# Patient Record
Sex: Male | Born: 1950 | Race: White | Hispanic: No | Marital: Married | Smoking: Current some day smoker
Health system: Southern US, Community
[De-identification: ages and names within clinical notes are randomized; demographics above are authoritative.]

## PROBLEM LIST (undated history)

## (undated) DIAGNOSIS — K5790 Diverticulosis of intestine, part unspecified, without perforation or abscess without bleeding: Secondary | ICD-10-CM

## (undated) DIAGNOSIS — E559 Vitamin D deficiency, unspecified: Secondary | ICD-10-CM

## (undated) DIAGNOSIS — N183 Chronic kidney disease, stage 3 unspecified: Secondary | ICD-10-CM

## (undated) DIAGNOSIS — N400 Enlarged prostate without lower urinary tract symptoms: Secondary | ICD-10-CM

## (undated) DIAGNOSIS — I10 Essential (primary) hypertension: Secondary | ICD-10-CM

## (undated) DIAGNOSIS — I2699 Other pulmonary embolism without acute cor pulmonale: Secondary | ICD-10-CM

## (undated) DIAGNOSIS — E7800 Pure hypercholesterolemia, unspecified: Secondary | ICD-10-CM

## (undated) DIAGNOSIS — E039 Hypothyroidism, unspecified: Secondary | ICD-10-CM

## (undated) DIAGNOSIS — I82409 Acute embolism and thrombosis of unspecified deep veins of unspecified lower extremity: Secondary | ICD-10-CM

## (undated) HISTORY — DX: Diverticulosis of intestine, part unspecified, without perforation or abscess without bleeding: K57.90

## (undated) HISTORY — DX: Other pulmonary embolism without acute cor pulmonale: I26.99

## (undated) HISTORY — DX: Acute embolism and thrombosis of unspecified deep veins of unspecified lower extremity: I82.409

## (undated) HISTORY — DX: Vitamin D deficiency, unspecified: E55.9

## (undated) HISTORY — DX: Pure hypercholesterolemia, unspecified: E78.00

## (undated) HISTORY — DX: Essential (primary) hypertension: I10

## (undated) HISTORY — DX: Chronic kidney disease, stage 3 unspecified: N18.30

## (undated) HISTORY — DX: Hypothyroidism, unspecified: E03.9

## (undated) HISTORY — PX: NO PAST SURGERIES: SHX2092

## (undated) HISTORY — DX: Benign prostatic hyperplasia without lower urinary tract symptoms: N40.0

---

## 2004-08-29 ENCOUNTER — Ambulatory Visit: Payer: Self-pay | Admitting: Internal Medicine

## 2004-09-11 ENCOUNTER — Ambulatory Visit: Payer: Self-pay | Admitting: Internal Medicine

## 2004-09-18 ENCOUNTER — Ambulatory Visit: Payer: Self-pay | Admitting: Internal Medicine

## 2004-10-10 ENCOUNTER — Ambulatory Visit: Payer: Self-pay | Admitting: Gastroenterology

## 2004-10-18 ENCOUNTER — Encounter: Payer: Self-pay | Admitting: Internal Medicine

## 2004-10-26 DIAGNOSIS — D126 Benign neoplasm of colon, unspecified: Secondary | ICD-10-CM

## 2004-10-26 HISTORY — DX: Benign neoplasm of colon, unspecified: D12.6

## 2004-10-31 ENCOUNTER — Ambulatory Visit: Payer: Self-pay | Admitting: Gastroenterology

## 2004-10-31 ENCOUNTER — Encounter (INDEPENDENT_AMBULATORY_CARE_PROVIDER_SITE_OTHER): Payer: Self-pay | Admitting: *Deleted

## 2004-10-31 ENCOUNTER — Encounter: Payer: Self-pay | Admitting: Internal Medicine

## 2004-11-26 ENCOUNTER — Encounter: Payer: Self-pay | Admitting: Internal Medicine

## 2005-01-22 ENCOUNTER — Ambulatory Visit: Payer: Self-pay | Admitting: Internal Medicine

## 2007-11-22 ENCOUNTER — Ambulatory Visit: Payer: Self-pay | Admitting: Internal Medicine

## 2007-11-22 LAB — CONVERTED CEMR LAB
Blood in Urine, dipstick: NEGATIVE
Nitrite: NEGATIVE
Protein, U semiquant: NEGATIVE
WBC Urine, dipstick: NEGATIVE

## 2007-11-25 LAB — CONVERTED CEMR LAB
ALT: 20 units/L (ref 0–53)
AST: 21 units/L (ref 0–37)
Alkaline Phosphatase: 34 units/L — ABNORMAL LOW (ref 39–117)
Basophils Absolute: 0 10*3/uL (ref 0.0–0.1)
Basophils Relative: 0.8 % (ref 0.0–3.0)
CO2: 28 meq/L (ref 19–32)
Chloride: 104 meq/L (ref 96–112)
Creatinine, Ser: 1.1 mg/dL (ref 0.4–1.5)
Direct LDL: 136.6 mg/dL
Eosinophils Relative: 6.4 % — ABNORMAL HIGH (ref 0.0–5.0)
HDL: 45.7 mg/dL (ref >39.0)
Lymphocytes Relative: 32.4 % (ref 12.0–46.0)
MCHC: 34.8 g/dL (ref 30.0–36.0)
Neutrophils Relative %: 53.9 % (ref 43.0–77.0)
Platelets: 234 10*3/uL (ref 150–400)
Potassium: 4.3 meq/L (ref 3.5–5.1)
RBC: 5.13 M/uL (ref 4.22–5.81)
Total Bilirubin: 1.1 mg/dL (ref 0.3–1.2)
Triglycerides: 119 mg/dL (ref 0–149)
VLDL: 24 mg/dL (ref 0–40)
WBC: 4.9 10*3/uL (ref 4.5–10.5)

## 2007-11-29 ENCOUNTER — Ambulatory Visit: Payer: Self-pay | Admitting: Internal Medicine

## 2007-11-29 DIAGNOSIS — L408 Other psoriasis: Secondary | ICD-10-CM | POA: Insufficient documentation

## 2007-11-29 DIAGNOSIS — R972 Elevated prostate specific antigen [PSA]: Secondary | ICD-10-CM | POA: Insufficient documentation

## 2007-11-29 DIAGNOSIS — I10 Essential (primary) hypertension: Secondary | ICD-10-CM | POA: Insufficient documentation

## 2007-11-29 DIAGNOSIS — B353 Tinea pedis: Secondary | ICD-10-CM | POA: Insufficient documentation

## 2007-12-03 ENCOUNTER — Encounter: Payer: Self-pay | Admitting: Internal Medicine

## 2007-12-17 ENCOUNTER — Encounter: Payer: Self-pay | Admitting: Internal Medicine

## 2007-12-28 ENCOUNTER — Encounter: Payer: Self-pay | Admitting: Internal Medicine

## 2008-01-17 ENCOUNTER — Ambulatory Visit: Payer: Self-pay | Admitting: Internal Medicine

## 2008-01-18 LAB — CONVERTED CEMR LAB
BUN: 25 mg/dL — ABNORMAL HIGH (ref 6–23)
CO2: 30 meq/L (ref 19–32)
Chloride: 105 meq/L (ref 96–112)
GFR calc Af Amer: 74 mL/min
Glucose, Bld: 99 mg/dL (ref 70–99)
Potassium: 4 meq/L (ref 3.5–5.1)
Sodium: 140 meq/L (ref 135–145)

## 2008-01-24 ENCOUNTER — Telehealth: Payer: Self-pay | Admitting: *Deleted

## 2008-07-18 ENCOUNTER — Encounter: Payer: Self-pay | Admitting: Internal Medicine

## 2008-10-19 ENCOUNTER — Encounter: Payer: Self-pay | Admitting: Internal Medicine

## 2008-11-10 ENCOUNTER — Encounter (INDEPENDENT_AMBULATORY_CARE_PROVIDER_SITE_OTHER): Payer: Self-pay | Admitting: *Deleted

## 2009-03-09 ENCOUNTER — Encounter (INDEPENDENT_AMBULATORY_CARE_PROVIDER_SITE_OTHER): Payer: Self-pay | Admitting: *Deleted

## 2009-03-13 ENCOUNTER — Encounter (INDEPENDENT_AMBULATORY_CARE_PROVIDER_SITE_OTHER): Payer: Self-pay | Admitting: *Deleted

## 2009-03-14 ENCOUNTER — Ambulatory Visit: Payer: Self-pay | Admitting: Gastroenterology

## 2009-03-26 ENCOUNTER — Telehealth: Payer: Self-pay | Admitting: *Deleted

## 2009-04-05 ENCOUNTER — Ambulatory Visit: Payer: Self-pay | Admitting: Gastroenterology

## 2009-04-09 ENCOUNTER — Encounter: Payer: Self-pay | Admitting: Gastroenterology

## 2009-09-04 ENCOUNTER — Ambulatory Visit: Payer: Self-pay | Admitting: Critical Care Medicine

## 2009-09-04 ENCOUNTER — Telehealth: Payer: Self-pay | Admitting: Internal Medicine

## 2009-10-05 ENCOUNTER — Telehealth: Payer: Self-pay | Admitting: Critical Care Medicine

## 2009-10-23 ENCOUNTER — Ambulatory Visit: Payer: Self-pay | Admitting: Critical Care Medicine

## 2009-10-23 ENCOUNTER — Encounter: Payer: Self-pay | Admitting: Internal Medicine

## 2009-10-23 LAB — CONVERTED CEMR LAB
BUN: 27 mg/dL — ABNORMAL HIGH (ref 6–23)
CO2: 27 meq/L (ref 19–32)
Chloride: 107 meq/L (ref 96–112)
Creatinine, Ser: 1.5 mg/dL (ref 0.4–1.5)
Glucose, Bld: 88 mg/dL (ref 70–99)
Potassium: 4.1 meq/L (ref 3.5–5.1)

## 2009-10-24 ENCOUNTER — Telehealth: Payer: Self-pay | Admitting: Pulmonary Disease

## 2009-10-24 ENCOUNTER — Ambulatory Visit: Payer: Self-pay | Admitting: Cardiology

## 2009-10-24 ENCOUNTER — Inpatient Hospital Stay (HOSPITAL_COMMUNITY): Admission: AD | Admit: 2009-10-24 | Discharge: 2009-10-26 | Payer: Self-pay | Admitting: Critical Care Medicine

## 2009-10-24 ENCOUNTER — Ambulatory Visit: Payer: Self-pay | Admitting: Critical Care Medicine

## 2009-10-24 ENCOUNTER — Ambulatory Visit: Payer: Self-pay | Admitting: Vascular Surgery

## 2009-10-24 ENCOUNTER — Encounter: Payer: Self-pay | Admitting: Critical Care Medicine

## 2009-10-24 DIAGNOSIS — I2699 Other pulmonary embolism without acute cor pulmonale: Secondary | ICD-10-CM | POA: Insufficient documentation

## 2009-10-31 ENCOUNTER — Ambulatory Visit: Payer: Self-pay | Admitting: Critical Care Medicine

## 2009-11-01 ENCOUNTER — Ambulatory Visit: Payer: Self-pay | Admitting: Oncology

## 2009-11-13 ENCOUNTER — Encounter: Payer: Self-pay | Admitting: Critical Care Medicine

## 2009-11-14 LAB — LUPUS ANTICOAGULANT PANEL
DRVVT: 85 secs — ABNORMAL HIGH (ref 36.2–44.3)
PTT Lupus Anticoagulant: 52.7 secs — ABNORMAL HIGH (ref 30.0–45.6)
PTTLA 4:1 Mix: 42.5 secs (ref 30.0–45.6)

## 2010-01-29 ENCOUNTER — Ambulatory Visit: Payer: Self-pay | Admitting: Critical Care Medicine

## 2010-01-30 ENCOUNTER — Encounter: Payer: Self-pay | Admitting: Critical Care Medicine

## 2010-01-30 ENCOUNTER — Ambulatory Visit: Payer: Self-pay | Admitting: Cardiovascular Disease

## 2010-02-01 ENCOUNTER — Ambulatory Visit: Payer: Self-pay | Admitting: Oncology

## 2010-02-14 ENCOUNTER — Ambulatory Visit: Payer: Self-pay

## 2010-02-14 ENCOUNTER — Encounter: Payer: Self-pay | Admitting: Critical Care Medicine

## 2010-02-26 ENCOUNTER — Telehealth: Payer: Self-pay | Admitting: Critical Care Medicine

## 2010-03-07 ENCOUNTER — Ambulatory Visit: Payer: Self-pay | Admitting: Oncology

## 2010-03-12 LAB — LUPUS ANTICOAGULANT PANEL
DRVVT 1:1 Mix: 43.8 secs (ref 36.2–44.3)
DRVVT: 74.6 secs — ABNORMAL HIGH (ref 36.2–44.3)
PTT Lupus Anticoagulant: 48.2 secs — ABNORMAL HIGH (ref 30.0–45.6)

## 2010-03-12 LAB — CARDIOLIPIN ANTIBODIES, IGG, IGM, IGA: Anticardiolipin IgG: 0 GPL U/mL (ref <23)

## 2010-03-12 LAB — BETA-2 GLYCOPROTEIN ANTIBODIES
Beta-2-Glycoprotein I IgA: 2 A Units (ref <20)
Beta-2-Glycoprotein I IgM: 7 M Units (ref <20)

## 2010-03-18 ENCOUNTER — Encounter: Payer: Self-pay | Admitting: Critical Care Medicine

## 2010-05-28 NOTE — Assessment & Plan Note (Signed)
Summary: Pulmonary OV   Copy to:  Dr. Thayer Headings Primary Provider/Referring Provider:  Dr. Thayer Headings  CC:  3 month follow up.  Pt states breathing is doing well overall.  Denies SOB, wheezing, chest tightness, and cough.  Would like to discuss having f/u CT done.Marland Kitchen  History of Present Illness: Pulmonary OV  10/24/09: Admit History as below: [name] with acute on chronic pulmonary embolism.  No prior hx other than HTN and elevated PSA  This patient's had a chronic cough  since January 2011.  Cough is productive of clear mucus. The patient does note postnasal drainage. The patient has been placed on nasal steroids without much improvement. Pulmonary functions had previously been obtained and were normal.  It is important to note the patient is on ACE inhibitor.  The pt was first seen by this MD 09/04/09 and I felt was primary cyclical cough with GERD.  However pt did have mildly low spo2.  No chest pain.  Cough always non productive.  No edema or LE symptoms. Pt seen in office in f/u 6/27 and still hypoxic and coughing. Stating could not get a deep breath and the berath was not helping him.   He desated with exertion.  We scheduled a CTA of chest 6/28.  I reorderd pulse steroids and started [facility] on the patient.    Pt today at CT scan found to have bilateral PE and thus pt admitted for further inpt care as direct admit for anticoagulation.   October 31, 2009 9:14 AM The pt now feels better and cough is gone.   The pt had diagosed acute on chronic PE.    No chest pain.  No leg pain.  The pt notes that all symptoms are resolved.   Pt has one dose lovenox left.  The INR  was 1.9 on 7/5 in PCP office. PCP is managing coumadin. coumadin dose is 5mg  daily.     Note lupus anticoagulant is positive on hypercoag panel    January 29, 2010 2:55 PM f/u massive PE and dvt.  Hypercoag w/u neg. F/u lupus anticoagulant was normal.  Pt is better with less dyspnea and cough.  No wheeze or chest pain.   No pain in legs.  Pt denies any significant sore throat, nasal congestion or excess secretions, fever, chills, sweats, unintended weight loss, pleurtic or exertional chest pain, orthopnea PND, or leg swelling Pt denies any increase in rescue therapy over baseline, denies waking up needing it or having any early am or nocturnal exacerbations of coughing/wheezing/or dyspnea.   Preventive Screening-Counseling & Management  Alcohol-Tobacco     Smoking Status: never     Passive Smoke Exposure: yes  Current Medications (verified): 1)  Benicar Hct 20-12.5 Mg  Tabs (Olmesartan Medoxomil-Hctz) .... One Tablet By Mouth Daily 2)  Vitamin D .... Once Daily 3)  Vitamin B .... Once Daily 4)  Vitamin C .... Once Daily 5)  Warfarin Sodium 5 Mg Tabs (Warfarin Sodium) .... Take As Directed  Allergies (verified): No Known Drug Allergies  Past History:  Past medical, surgical, family and social histories (including risk factors) reviewed, and no changes noted (except as noted below).  Past Medical History: elevated PSA   bx neg Hypertension Pulmonary embolism/DVT 6/11    -f/u CTA Chest 10/11>>>No PE  Past Surgical History: Reviewed history from 09/04/2009 and no changes required. prostate bx x 3  Family History: Reviewed history from 01/17/2008 and no changes required. father had HBP and died  of stroke   60     ethoh  and poor diet  GF died  79 sibling  4 bros and sis good health.  no   colon cancer  no prostate cancer    Social History: Reviewed history from 09/04/2009 and no changes required. sleep better    now  ethoh   3-4 per week  caffeine    no recent exercise .    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  Review of Systems  The patient denies shortness of breath with activity, shortness of breath at rest, productive cough, non-productive cough, coughing up blood, chest pain, irregular heartbeats, acid  heartburn, indigestion, loss of appetite, weight change, abdominal pain, difficulty swallowing, sore throat, tooth/dental problems, headaches, nasal congestion/difficulty breathing through nose, sneezing, itching, ear ache, anxiety, depression, hand/feet swelling, joint stiffness or pain, rash, change in color of mucus, and fever.    Vital Signs:  Patient profile:   60 year old male Height:      70 inches Weight:      209.38 pounds BMI:     30.15 O2 Sat:      96 % on Room air Temp:     97.8 degrees F oral Pulse rate:   82 / minute BP sitting:   118 / 74  (left arm) Cuff size:   large  Vitals Entered By: Gweneth Dimitri RN (January 29, 2010 2:38 PM)  O2 Flow:  Room air CC: 3 month follow up.  Pt states breathing is doing well overall.  Denies SOB, wheezing, chest tightness, cough.  Would like to discuss having f/u CT done. Comments Medications reviewed with patient Daytime contact number verified with patient. Gweneth Dimitri RN  January 29, 2010 2:38 PM    Physical Exam  Additional Exam:  Gen: Pleasant, well-nourished, in no distress,  normal affect ENT: No lesions,  mouth clear,  oropharynx clear, no postnasal drip Neck: No JVD, no TMG, no carotid bruits Lungs: No use of accessory muscles, no dullness to percussion, clear without rales or rhonchi Cardiovascular: RRR, heart sounds normal, no murmur or gallops, no peripheral edema Abdomen: soft and NT, no HSM,  BS normal Musculoskeletal: No deformities, no cyanosis or clubbing Neuro: alert, non focal Skin: Warm, no lesions or rashes    CT of Chest  Procedure date:  01/30/2010  Findings:      IMPRESSION: Interval complete resolution of bilateral pulmonary embolic disease.  Impression & Recommendations:  Problem # 1:  PULMONARY EMBOLISM (ICD-415.19) Assessment Improved Resolved PE on CT chest noted 10/11.   plan plan 9months warfarin rx check venous doppler LE His updated medication list for this problem includes:     Warfarin Sodium 5 Mg Tabs (Warfarin sodium) .Marland Kitchen... Take as directed  Orders: Est. Patient Level III (78295) Doppler Referral (Doppler) Radiology Referral (Radiology)  Complete Medication List: 1)  Benicar Hct 20-12.5 Mg Tabs (Olmesartan medoxomil-hctz) .... One tablet by mouth daily 2)  Vitamin D  .... Once daily 3)  Vitamin B  .... Once daily 4)  Vitamin C  .... Once daily 5)  Warfarin Sodium 5 Mg Tabs (Warfarin sodium) .... Take as directed  Patient Instructions: 1)  A CT Scan will be done 2)  A venous doppler u/s will be done 3)  Stay on coumadin for now 4)  Return 4 months   Immunization History:  Influenza Immunization History:  Influenza:  historical (12/27/2009)   Appended Document: Pulmonary OV fax  brian Thea Silversmith

## 2010-05-28 NOTE — Letter (Signed)
Summary: [facility]  [facility]   Imported By: Sherian Rein 03/28/2010 14:04:51  _____________________________________________________________________  External Attachment:    Type:   Image     Comment:   External Document

## 2010-05-28 NOTE — Miscellaneous (Signed)
Summary: Orders Update  Clinical Lists Changes  Orders: Added new Test order of Venous Duplex Lower Extremity (Venous Duplex Lower) - Signed 

## 2010-05-28 NOTE — Miscellaneous (Signed)
Summary: Orders Update pft charges  Clinical Lists Changes  Orders: Added new Service order of Lung Volumes (16109) - Signed Added new Service order of Spirometry (Pre & Post) [phone]) - Signed

## 2010-05-28 NOTE — Progress Notes (Signed)
Summary: Call Report  Phone Note Other Incoming   Summary of Call: Received call report on CT from Dr. Deanne Coffer with Radiology.  Per Dr. Deanne Coffer, pt has large bilateral cental pulmonary emboli.  Requesting for [place] to call [phone] on what to do with pt---he is still waiting at [facility].  PW is out of the office this am, will address this with doc of the day. Dr. Shelle Iron, pls advise.  Thanks! Initial call taken by: Gweneth Dimitri RN,  October 24, 2009 9:53 AM  Follow-up for Phone Call        Per [facility], pt needs to go to ER at [facility] now.   Called radiology, spoke with Rose.  Informed her of this.  She verbalized understanding.  Will forward message to PW as FYI.  Gweneth Dimitri RN  October 24, 2009 9:59 AM  Follow-up by: Barbaraann Share MD,  October 24, 2009 10:03 AM

## 2010-05-28 NOTE — Assessment & Plan Note (Signed)
Summary: Pulmonary Consultation   Copy to:  Dr. Thayer Headings Primary Provider/Referring Provider:  Dr. Thayer Headings  CC:  Pulmonary Consult for cough..  History of Present Illness: Pulmonary Consultation       This is a 60 year old male who presents with cough.  The patient complains of shortness of breath, chest tightness, wheezing, cough, mucous production, exercise induced symptoms, and congestion, but denies history of diagnosed COPD, chest pain worse with breathing and coughing, and nocturnal awakening.  The cough is described as non-productive and productive of clear sputum.  The dyspnea is described as dyspnea at rest, dyspnea with exertion, worse with inspiration, and associated with wheezing.  The patient reports a history of occupational exposure:.  The patient denies any history of asthma, allergic rhinitis, COPD, sleep disordered breathing, obstructive sleep apnea, heart disease, thyroid disease, anemia, collagen vascular disease, osteporosis, kyphoscoliosis, occupational exposure: , and cancer: .  Ineffective therapies have included nasal inhaled steroids and antibiotics.    This patient's had a chronic cough  since January 2011.  Cough is productive of clear mucus. The patient does note postnasal drainage. The patient has been placed on nasal steroids without much improvement. Pulmonary functions had previously been obtained and were normal.  It is important to note the patient is on ACE inhibitor.   Preventive Screening-Counseling & Management  Alcohol-Tobacco     Smoking Status: never     Passive Smoke Exposure: yes  Current Medications (verified): 1)  Prinzide 10-12.5 Mg  Tabs (Lisinopril-Hydrochlorothiazide) .Marland Kitchen.. 1 By Mouth Once Daily For High Blood Pressure 2)  Vitamin D .... Once Daily 3)  Vitamin B .... Once Daily 4)  Vitamin C .... Once Daily  Allergies (verified): No Known Drug Allergies  Past History:  Past medical, surgical, family and social histories  (including risk factors) reviewed, and no changes noted (except as noted below).  Past Medical History: Reviewed history from 01/17/2008 and no changes required. elevated PSA   bx neg Hypertension  Past Surgical History: prostate bx x 3  Family History: Reviewed history from 01/17/2008 and no changes required. father had HBP and died of stroke   46     ethoh  and poor diet  GF died  48 sibling  4 bros and sis good health.  no   colon cancer  no prostate cancer    Social History: Reviewed history from 01/17/2008 and no changes required. sleep better    now  ethoh   3-4 per week  caffeine    no recent exercise .    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  The patient denies shortness of breath at rest, non-productive cough, coughing up blood, chest pain, irregular heartbeats, acid heartburn, indigestion, loss of appetite, weight change, abdominal pain, difficulty swallowing, sore throat, tooth/dental problems, headaches, nasal congestion/difficulty breathing through nose, sneezing, itching, ear ache, anxiety, depression, hand/feet swelling, joint stiffness or pain, rash, change in color of mucus, and fever.        See HPI for Pulmonary, Cardiac, ENT, and General review of systems.  Vital Signs:  Patient profile:   60 year old male Height:      70 inches Weight:      205 pounds BMI:     29.52 O2 Sat:  89 % on Room air Temp:     97.7 degrees F oral Pulse rate:   99 / minute BP sitting:   102 / 70  (left arm) Cuff size:   regular  Vitals Entered By: Gweneth Dimitri RN (Sep 04, 2009 1:41 PM)  O2 Flow:  Room air  O2 Sat Comments Pt arrived to exam room with o2 sat 89% RA.  After resting for a few minutes, o2 sat increased to 92% RA with pulse of 102.  Gweneth Dimitri RN  Sep 04, 2009 1:43  PM  CC: Pulmonary Consult for cough. Comments Medications reviewed with patient Daytime contact number verified with patient. Gweneth Dimitri RN  Sep 04, 2009 1:42 PM    Physical Exam  Additional Exam:  Gen: Pleasant, well-nourished, in no distress,  normal affect ENT: No lesions,  mouth clear,  oropharynx clear, no postnasal drip Neck: No JVD, no TMG, no carotid bruits Lungs: No use of accessory muscles, no dullness to percussion, clear without rales or rhonchi Cardiovascular: RRR, heart sounds normal, no murmur or gallops, no peripheral edema Abdomen: soft and NT, no HSM,  BS normal Musculoskeletal: No deformities, no cyanosis or clubbing Neuro: alert, non focal Skin: Warm, no lesions or rashes    CXR  Procedure date:  06/25/2009  Findings:      Mild peribronchial thickening  Pulmonary Function Test Date: 09/04/2009 1:58 PM Gender: Male  Pre-Spirometry FVC    Value: 3.72 L/min   % Pred: 76.80 % FEV1    Value: 3.02 L     Pred: 3.70 L     % Pred: 81.70 % FEV1/FVC  Value: 81.28 %     % Pred: 106.30 %  Impression & Recommendations:  Problem # 1:  COUGH (ICD-786.2) Assessment Unchanged DDX is cough variant asthma, upper airway cough syndrome (previously termed postnasal drip syndrome) or GERD, although many studies of chronic cough show pts have more than one mechanism Of the three most common causes of chronic cough, only one can actually cause the other two and perpertuate the cycle of cough inducing airway trauma, inflammation, heightened sensitivity to reflux which is prompted by the cough itself via a cyclical mechanism. This may partially respond to steroids and look like asthma and postnasal drainage but never erradicated completely unless the cough and the secondary reflux are eliminated, preferably both at the same time.  I suspect this pt has upper airway instability and GERD inducing cyclical cough. I doubt true asthma.    The Ace inhibitor is significantly  lowering the cough threshold here.  plan Start Cyclic Cough protocol using Tussicaps/benzonatate Stop prinizide Start benicar HCT one daily  use samples Start Dexilant one daily  Use samples then switch to omeprazole one daily Follow Reflux Diet A depomedrol injection will be given 120mg  IM Return one month for recheck  Orders: Depo- Medrol 80mg  (J1040) Depo- Medrol 40mg  (J1030) Admin of Therapeutic Inj  intramuscular or subcutaneous (45409)  Medications Added to Medication List This Visit: 1)  Benicar Hct 20-12.5 Mg Tabs (Olmesartan medoxomil-hctz) .... One tablet by mouth daily 2)  Vitamin D  .... Once daily 3)  Vitamin B  .... Once daily 4)  Vitamin C  .... Once daily 5)  Tussicaps 10-8 Mg Xr12h-cap (Hydrocod polst-chlorphen polst) .... One by mouth two times a day as needed cough 6)  Benzonatate 100 Mg Caps (Benzonatate) .... Take one to two by mouth every 4-6 hours as directed for cough 7)  Omeprazole 20 Mg Cpdr (  Omeprazole) .... By mouth daily. take one half hour before eating.  Complete Medication List: 1)  Benicar Hct 20-12.5 Mg Tabs (Olmesartan medoxomil-hctz) .... One tablet by mouth daily 2)  Vitamin D  .... Once daily 3)  Vitamin B  .... Once daily 4)  Vitamin C  .... Once daily 5)  Tussicaps 10-8 Mg Xr12h-cap (Hydrocod polst-chlorphen polst) .... One by mouth two times a day as needed cough 6)  Benzonatate 100 Mg Caps (Benzonatate) .... Take one to two by mouth every 4-6 hours as directed for cough 7)  Omeprazole 20 Mg Cpdr (Omeprazole) .... By mouth daily. take one half hour before eating.  Other Orders: New Patient Level V (16109) Spirometry w/Graph (94010)  Patient Instructions: 1)  Start Cyclic Cough protocol using Tussicaps/benzonatate 2)  Stop prinizide 3)  Start benicar HCT one daily  use samples 4)  Start Dexilant one daily  Use samples then switch to omeprazole one daily 5)  Follow Reflux Diet 6)  Focus on weight loss with the diet 7)  You do not  appear to have Asthma. 8)  A depomedrol injection will be given 120mg  IM 9)  Return one month for recheck Prescriptions: OMEPRAZOLE 20 MG  CPDR (OMEPRAZOLE) By mouth daily. Take one half hour before eating.  #30 x 0   Entered and Authorized by:   Storm Frisk MD   Signed by:   Storm Frisk MD on 09/04/2009   Method used:   Electronically to        [facility]* (retail)       [address]       [place], [place]  [id number]       Ph: [phone]       Fax: [phone]   RxID:   [id number] BENZONATATE 100 MG CAPS (BENZONATATE) Take one to two by mouth every 4-6 hours as directed for cough  #90 x 6   Entered and Authorized by:   Storm Frisk MD   Signed by:   Storm Frisk MD on 09/04/2009   Method used:   Electronically to        [facility]* (retail)       [address]       [place], [place]  [id number]       Ph: [phone]       Fax: [phone]   RxID:   [id number] TUSSICAPS 10-8 MG XR12H-CAP (HYDROCOD POLST-CHLORPHEN POLST) One by mouth two times a day as needed cough  #20 x 0   Entered and Authorized by:   Storm Frisk MD   Signed by:   Storm Frisk MD on 09/04/2009   Method used:   Print then Give to Patient   RxID:   [id number]    Immunization History:  Influenza Immunization History:    Influenza:  historical (01/26/2009)    CardioPerfect Spirometry  ID: [id number] Patient: JOASH, TONY DOB: [birth year] Age: 60 Years Old Sex: Male Race: White Physician: Madelin Headings MD Height: 70 Weight: 205 Status: Confirmed Past Medical History:  elevated PSA   bx neg Hypertension  Recorded: 09/04/2009 1:58 PM  Parameter  Measured Predicted %Predicted FVC     3.72        4.84        76.80 FEV1     3.02        3.70        81.70 FEV1%   81.28  76.50        106.30 PEF    9.49        9.43        100.70   Comments: Normal Spirometry  Interpretation: Pre: FVC= 3.72L FEV1= 3.02L FEV1%=  81.3% 3.02/3.72 FEV1/FVC (09/04/2009 2:01:09 PM), Mild restriction     Medication Administration  Injection # 1:    Medication: Depo- Medrol 80mg     Diagnosis: COUGH (ICD-786.2)    Route: IM    Site: LUOQ gluteus    Exp Date: 02/2012    Lot #: [id number]    Mfr: [employer]    Patient tolerated injection without complications    Given by: Gweneth Dimitri RN (Sep 04, 2009 2:43 PM)  Injection # 2:    Medication: Depo- Medrol 40mg     Diagnosis: COUGH (ICD-786.2)    Route: IM    Site: LUOQ gluteus    Exp Date: 02/2012    Lot #: [id number]    Mfr: [employer]    Patient tolerated injection without complications    Given by: Gweneth Dimitri RN (Sep 04, 2009 2:43 PM)  Orders Added: 1)  New Patient Level V [99205] 2)  Spirometry w/Graph [94010] 3)  Depo- Medrol 80mg  [J1040] 4)  Depo- Medrol 40mg  [J1030] 5)  Admin of Therapeutic Inj  intramuscular or subcutaneous [96372]   Appended Document: Pulmonary Consultation fax Thayer Headings

## 2010-05-28 NOTE — Progress Notes (Signed)
Summary: refill on lisinopril/hctz  Phone Note From Pharmacy   Caller: [facility] Reason for Call: Needs renewal Details for Reason: lisinopril/hctz Summary of Call: Pt needs to schedule a follow up appt.  Last ov was in 2009. Left message for pt to call back. Pt was notified of this back in Nov 2010. Initial call taken by: Romualdo Bolk, CMA (AAMA),  Sep 04, 2009 9:48 AM

## 2010-05-28 NOTE — Progress Notes (Signed)
Summary: Venous dopplers negative  Phone Note Outgoing Call   Reason for Call: Discuss lab or test results Summary of Call: call pt and tell him leg venous dopplers were negative for blood clots Initial call taken by: Storm Frisk MD,  February 26, 2010 6:24 AM  Follow-up for Phone Call        [facility] Crystal Michelsen RN  February 26, 2010 8:50 AM   Called, spoke with pt. He was informed of above results per PW and verbalized understanding. Follow-up by: Gweneth Dimitri RN,  February 26, 2010 4:41 PM

## 2010-05-28 NOTE — Assessment & Plan Note (Signed)
Summary: Pulmonary OV   Copy to:  Dr. Thayer Headings Primary Provider/Referring Provider:  Dr. Thayer Headings  CC:  1 month follow up.  Pt states cough resolved for a couples weeks but then returned on Memorial Day.  States cough is dry.  States he also starting having SOB with activity around [facility] Day.Marland Kitchen  History of Present Illness: Pulmonary OV        60 year old male who presents with cough and chronic DOE ?etiology.    This patient's had a chronic cough  since January 2011.  Cough is productive of clear mucus. The patient does note postnasal drainage. The patient has been placed on nasal steroids without much improvement. Pulmonary functions had previously been obtained and were normal.  It is important to note the patient is on ACE inhibitor.  First OV 09/04/09.  My initial Dx was cyclic cough and GERD   October 23, 2009 3:12 PM Since last ov the cough  got better with tussicaps , then went on vacation and then got worse,  When exerted self got very out of breath.  Was breathing hard and felt like would pass out. Then the cough came back.  rx was on 09/04/09: Start Cyclic Cough protocol using Tussicaps/benzonatate Stop prinizide Start benicar HCT one daily  use samples Start Dexilant one daily  Use samples then switch to omeprazole one daily Follow Reflux Diet If is quiet is fine. Now any degree of exertion is worse ? if getting enough oxygen.  No wheeze. PFTs today are not c/w with signficant airway obstruction. There is no chest pain.  Pulmonary Function Test Date: 10/23/2009 Height (in.): 71 Gender: Male  Pre-Spirometry FVC    Value: 4.10 L/min   Pred: 4.84 L/min     % Pred: 85 % FEV1    Value: 3.22 L     Pred: 3.45 L     % Pred: 93 % FEV1/FVC  Value: 78 %     Pred: 71 %    FEF 25-75  Value: 3.05 L/min   Pred: 3.29 L/min     % Pred: 93 %  Post-Spirometry FVC    Value: 4.09 L/min   Pred: 4.84 L/min     % Pred: 85 % FEV1    Value: 3.39 L     Pred: 3.45 L     % Pred: 98  % FEV1/FVC  Value: 83 %     Pred: 71 %    FEF 25-75  Value: 3.81 L/min   Pred: 3.29 L/min     % Pred: 116 %  Lung Volumes TLC    Value: 6.40 L   % Pred: 92 % RV    Value: 2.30 L   % Pred: 96 %   Preventive Screening-Counseling & Management  Alcohol-Tobacco     Smoking Status: never     Passive Smoke Exposure: yes  Current Medications (verified): 1)  Benicar Hct 20-12.5 Mg  Tabs (Olmesartan Medoxomil-Hctz) .... One Tablet By Mouth Daily 2)  Vitamin D .... Once Daily 3)  Vitamin B .... Once Daily 4)  Vitamin C .... Once Daily 5)  Tussicaps 10-8 Mg Xr12h-Cap (Hydrocod Polst-Chlorphen Polst) .... One By Mouth Two Times A Day As Needed Cough 6)  Omeprazole 20 Mg Cpdr (Omeprazole) .... Take 1 Tablet By Mouth Once A Day  Allergies (verified): No Known Drug Allergies  Past History:  Past medical, surgical, family and social histories (including risk factors) reviewed, and no changes noted (except as  noted below).  Past Medical History: Reviewed history from 01/17/2008 and no changes required. elevated PSA   bx neg Hypertension  Past Surgical History: Reviewed history from 09/04/2009 and no changes required. prostate bx x 3  Family History: Reviewed history from 01/17/2008 and no changes required. father had HBP and died of stroke   63     ethoh  and poor diet  GF died  16 sibling  4 bros and sis good health.  no   colon cancer  no prostate cancer    Social History: Reviewed history from 09/04/2009 and no changes required. sleep better    now  ethoh   3-4 per week  caffeine    no recent exercise .    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  The patient denies shortness of breath at rest, productive cough, coughing up blood, chest pain, irregular heartbeats, acid heartburn, indigestion, loss of appetite, weight change, abdominal pain,  difficulty swallowing, sore throat, tooth/dental problems, headaches, nasal congestion/difficulty breathing through nose, sneezing, itching, ear ache, anxiety, depression, hand/feet swelling, joint stiffness or pain, rash, change in color of mucus, and fever.    Vital Signs:  Patient profile:   60 year old male Height:      70 inches Weight:      200 pounds BMI:     28.80 O2 Sat:      94 % on Room air Temp:     97.8 degrees F oral Pulse rate:   96 / minute BP sitting:   94 / 66  (left arm) Cuff size:   regular  Vitals Entered By: Gweneth Dimitri RN (October 23, 2009 2:56 PM)  O2 Flow:  Room air CC: 1 month follow up.  Pt states cough resolved for a couples weeks but then returned on Memorial Day.  States cough is dry.  States he also starting having SOB with activity around [facility] Day. Comments Medications reviewed with patient Daytime contact number verified with patient. Gweneth Dimitri RN  October 23, 2009 2:56 PM  Ambulatory Pulse Oximetry  Resting; HR__105___    02 Sat_90% RA____  Lap1 (185 feet)   HR__115___   02 Sat__89% RA___ Lap2 (185 feet)   HR__120___   02 Sat__83% RA Lap3 (185 feet)   HR_____   02 Sat_____  ___Test Completed without Difficulty _x__Test Stopped due to:   Pt o2 sat 83% RA at end of 2nd lap.  Pt placed on 2L o2, o2 sat increased to 94% with pulse of 92.   Gweneth Dimitri RN  October 23, 2009 3:29 PM     Physical Exam  Additional Exam:  Gen: Pleasant, well-nourished, in no distress,  normal affect ENT: No lesions,  mouth clear,  oropharynx clear, no postnasal drip Neck: No JVD, no TMG, no carotid bruits Lungs: No use of accessory muscles, no dullness to percussion, clear without rales or rhonchi Cardiovascular: RRR, heart sounds normal, no murmur or gallops, no peripheral edema Abdomen: soft and NT, no HSM,  BS normal Musculoskeletal: No deformities, no cyanosis or clubbing Neuro: alert, non focal Skin: Warm, no lesions or rashes    CT of  Chest  Procedure date:  10/24/2009  Findings:      Findings:  There is bilateral PE with near occlusive thrombus at the bifurcation of the right main pulmonary artery  multiple proximal segmental occlusive emboli on the left involving primarily lingular and lower lobe branches.   There is no pleural or pericardial effusion.  There is no definite intraventricular septal deviation.  There are normal sized subcentimeter precarinal and prevascular lymph nodes.  No hilar adenopathy.   There are some patchy nonspecific ground-glass opacities in the superior segment left lower lobe.  Lungs otherwise clear.   In the visualized upper abdomen, there is a 16 mm probable hepatic cyst in the left lobe near the dome, image 72/4.  There is an exophytic 2.8 cm probable simple cyst from the upper pole right kidney, incompletely visualized.   Review of the MIP images confirms the above findings.   IMPRESSION:   1.  Bilateral central pulmonary emboli. I telephoned the critical test results to Crystal RN in Dr. Lynelle Doctor office at the time of interpretation.    Pulmonary Function Test Date: 10/23/2009 Height (in.): 71 Gender: Male  Pre-Spirometry FVC    Value: 4.10 L/min   Pred: 4.84 L/min     % Pred: 85 % FEV1    Value: 3.22 L     Pred: 3.45 L     % Pred: 93 % FEV1/FVC  Value: 78 %     Pred: 71 %    FEF 25-75  Value: 3.05 L/min   Pred: 3.29 L/min     % Pred: 93 %  Post-Spirometry FVC    Value: 4.09 L/min   Pred: 4.84 L/min     % Pred: 85 % FEV1    Value: 3.39 L     Pred: 3.45 L     % Pred: 98 % FEV1/FVC  Value: 83 %     Pred: 71 %    FEF 25-75  Value: 3.81 L/min   Pred: 3.29 L/min     % Pred: 116 %  Lung Volumes TLC    Value: 6.40 L   % Pred: 92 % RV    Value: 2.30 L   % Pred: 96 %  Impression & Recommendations:  Problem # 1:  PULMONARY EMBOLISM (ICD-415.19) Assessment Deteriorated This pt was seen in the office 6/28,  I ordered a CTA angio of the chest for the AM 6/29. to work up  exertional dyspnea.  See following admit H and P.  The CTA showed acute on chronic pulmonary embolism. This patient was then admitted directly to the ICU and anticoagulated with IV heparin.  plan IV heparin/lovenox and coumadin would rx coumadin for life given severity of clot burden,  subsequently found to have clot in R common femoral vein as well.   Large clot burden in lung found case discussed wiht dr Thea Silversmith who will manage coumadin as an outpt upon d/c  I initially rx dulera and pred pulse on 6/28 but d/c this on 6/29 wiht dx of PE/DVT confirmed.  Medications Added to Medication List This Visit: 1)  Omeprazole 20 Mg Cpdr (Omeprazole) .... Take 1 tablet by mouth once a day 2)  Prednisone 10 Mg Tabs (Prednisone) .... Take as directed 4 each am x3days, 3 x 3days, 2 x 3days, 1 x 3days then stop 3)  Dulera 200-5 Mcg/act Aero (Mometasone furo-formoterol fum) .... 2 puffs twice per day  Complete Medication List: 1)  Benicar Hct 20-12.5 Mg Tabs (Olmesartan medoxomil-hctz) .... One tablet by mouth daily 2)  Vitamin D  .... Once daily 3)  Vitamin B  .... Once daily 4)  Vitamin C  .... Once daily 5)  Tussicaps  10-8 Mg Xr12h-cap (Hydrocod polst-chlorphen polst) .... One by mouth two times a day as needed cough 6)  Omeprazole 20 Mg Cpdr (Omeprazole) .... Take 1 tablet by mouth once a day 7)  Prednisone 10 Mg Tabs (Prednisone) .... Take as directed 4 each am x3days, 3 x 3days, 2 x 3days, 1 x 3days then stop 8)  Dulera 200-5 Mcg/act Aero (Mometasone furo-formoterol fum) .... 2 puffs twice per day  Other Orders: Pulse Oximetry, Ambulatory (60454) Est. Patient Level V (09811) Radiology Referral (Radiology) Radiology Referral (Radiology) TLB-BMP (Basic Metabolic Panel-BMET) (80048-METABOL) Pulmonary Referral (Pulmonary)  Patient Instructions: 1)  A CT scan of the chest will be obtained 10/24/09 to rule out blood clots 2)  Start Dulera two puff twice daily 3)  Start prednisone 10mg  4 each am  x3days, 3 x 3days, 2 x 3days, 1 x 3days then stop 4)  Pulmonary Functions were done today in the office 5)  Return one week, I will call with CT Chest result  Prescriptions: DULERA 200-5 MCG/ACT AERO (MOMETASONE FURO-FORMOTEROL FUM) 2 puffs twice per day  #1 x 6   Entered and Authorized by:   Storm Frisk MD   Signed by:   Storm Frisk MD on 10/23/2009   Method used:   Electronically to        [facility]* (retail)       [address]       [place], [place]  [id number]       Ph: [phone]       Fax: [phone]   RxID:   [id number] PREDNISONE 10 MG  TABS (PREDNISONE) Take as directed 4 each am x3days, 3 x 3days, 2 x 3days, 1 x 3days then stop  #30 x 0   Entered and Authorized by:   Storm Frisk MD   Signed by:   Storm Frisk MD on 10/23/2009   Method used:   Electronically to        [facility]* (retail)       [address]       [place], [place]  [id number]       Ph: [phone]       Fax: [phone]   RxID:   [id number]   Appended Document: Pulmonary OV fax Thayer Headings

## 2010-05-28 NOTE — Assessment & Plan Note (Signed)
Summary: Pulmonary OV   Copy to:  Dr. Thayer Headings Primary Provider/Referring Provider:  Dr. Thayer Headings  CC:  [facility] Follow up.  Pt states breathing is 100% better and cough has resolved.  Hunter Sandoval  History of Present Illness: Pulmonary OV  10/24/09: Admit History as below: [name] with acute on chronic pulmonary embolism.  No prior hx other than HTN and elevated PSA  This patient's had a chronic cough  since January 2011.  Cough is productive of clear mucus. The patient does note postnasal drainage. The patient has been placed on nasal steroids without much improvement. Pulmonary functions had previously been obtained and were normal.  It is important to note the patient is on ACE inhibitor.  The pt was first seen by this MD 09/04/09 and I felt was primary cyclical cough with GERD.  However pt did have mildly low spo2.  No chest pain.  Cough always non productive.  No edema or LE symptoms. Pt seen in office in f/u 6/27 and still hypoxic and coughing. Stating could not get a deep breath and the berath was not helping him.   He desated with exertion.  We scheduled a CTA of chest 6/28.  I reorderd pulse steroids and started [facility] on the patient.    Pt today at CT scan found to have bilateral PE and thus pt admitted for further inpt care as direct admit for anticoagulation.   October 31, 2009 9:14 AM The pt now feels better and cough is gone.   The pt had diagosed acute on chronic PE.    No chest pain.  No leg pain.  The pt notes that all symptoms are resolved.   Pt has one dose lovenox left.  The INR  was 1.9 on 7/5 in PCP office. PCP is managing coumadin. coumadin dose is 5mg  daily.     Note lupus anticoagulant is positive on hypercoag panel      Preventive Screening-Counseling & Management  Alcohol-Tobacco     Smoking Status: never     Passive Smoke Exposure: yes  Current Medications (verified): 1)  Benicar Hct 20-12.5 Mg  Tabs (Olmesartan Medoxomil-Hctz) .... One Tablet By  Mouth Daily 2)  Vitamin D .... Once Daily 3)  Vitamin B .... Once Daily 4)  Vitamin C .... Once Daily 5)  Warfarin Sodium 5 Mg Tabs (Warfarin Sodium) .... Take As Directed 6)  Lovenox 100 Mg/ml Soln (Enoxaparin Sodium) .... Two Times A Day  Allergies (verified): No Known Drug Allergies  Review of Systems  The patient denies shortness of breath with activity, shortness of breath at rest, productive cough, non-productive cough, coughing up blood, chest pain, irregular heartbeats, acid heartburn, indigestion, loss of appetite, weight change, abdominal pain, difficulty swallowing, sore throat, tooth/dental problems, headaches, nasal congestion/difficulty breathing through nose, sneezing, itching, ear ache, anxiety, depression, hand/feet swelling, joint stiffness or pain, rash, change in color of mucus, and fever.    Vital Signs:  Patient profile:   60 year old male Height:      70 inches Weight:      200.31 pounds BMI:     28.85 O2 Sat:      97 % on Room air Temp:     98.0 degrees F oral Pulse rate:   74 / minute BP sitting:   100 / 78  (left arm)  Vitals Entered By: Gweneth Dimitri RN (October 31, 2009 9:07 AM)  O2 Flow:  Room air  Serial Vital Signs/Assessments:  Comments: Ambulatory Pulse Oximetry  Resting; HR__73___    02 Sat__98%RA___  Lap1 (185 feet)   HR__84___   02 Sat_99%RA____ Lap2 (185 feet)   HR__83___   02 Sat_99%RA____    Lap3 (185 feet)   HR__83___   02 Sat_98%RA____  __x_Test Completed without Difficulty ___Test Stopped due to: Zackery Barefoot CMA  October 31, 2009 9:35 AM    By: Zackery Barefoot CMA   CC: Post Hospital Follow up.  Pt states breathing is 100% better and cough has resolved.   Comments Medications reviewed with patient Daytime contact number verified with patient. Gweneth Dimitri RN  October 31, 2009 9:08 AM    Physical Exam  Additional Exam:  Gen: Pleasant, well-nourished, in no distress,  normal affect ENT: No lesions,  mouth clear,   oropharynx clear, no postnasal drip Neck: No JVD, no TMG, no carotid bruits Lungs: No use of accessory muscles, no dullness to percussion, clear without rales or rhonchi Cardiovascular: RRR, heart sounds normal, no murmur or gallops, no peripheral edema Abdomen: soft and NT, no HSM,  BS normal Musculoskeletal: No deformities, no cyanosis or clubbing Neuro: alert, non focal Skin: Warm, no lesions or rashes    Impression & Recommendations:  Problem # 1:  PULMONARY EMBOLISM (ICD-415.19) Assessment Improved Acute on chronic PE wiht lupus anticoagulant on hypercoag panel plan INR today 1.5,  he needs 24hrs more lovenox and coumadin dose increased to 10mg /d rov with PCP coumadin clinic on 7/8 for inr recheck rechk CTChest in 5 weeks Hematology referral regarding length of therapy with lupus anticoagulant His updated medication list for this problem includes:    Warfarin Sodium 5 Mg Tabs (Warfarin sodium) .Hunter Sandoval... Take as directed  Orders: Pulse Oximetry, Ambulatory (76160) Est. 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 Take as directed 2)  Lovenox 100 Mg/ml Soln (Enoxaparin sodium) .... Two times a day  Complete Medication List: 1)  Benicar Hct 20-12.5 Mg Tabs (Olmesartan medoxomil-hctz) .... One tablet by mouth daily 2)  Vitamin D  .... Once daily 3)  Vitamin B  .... Once daily 4)  Vitamin C  .... Once daily 5)  Warfarin Sodium 5 Mg Tabs (Warfarin sodium) .... Take as directed  Patient Instructions: 1)  Increase coumadin to 10mg /d for two days then get PT/INR repeated 11/02/09 2)  Continue lovenox through 11/01/09. 3)  A repeat CT chest will be obtained in 5 weeks 4)  A hematology consult  will be obtained  5)  Return 2 months Prescriptions: LOVENOX 100 MG/ML SOLN (ENOXAPARIN SODIUM) 90mg  subcutaneously twice daily  #1 day supp x 0   Entered and Authorized by:   Storm Frisk MD   Signed by:   Storm Frisk MD on 10/31/2009   Method used:   Print then Give to Patient   RxID:   [id number]    Immunization History:  Pneumovax Immunization History:    Pneumovax:  historical (10/25/2009)   Appended Document: Pulmonary OV fax Thayer Headings

## 2010-05-28 NOTE — Letter (Signed)
[facility]   Imported By: Sherian Rein 12/04/2009 07:25:41  _____________________________________________________________________  External Attachment:    Type:   Image     Comment:   External Document

## 2010-05-28 NOTE — Progress Notes (Signed)
Summary: samples  Phone Note Call from Patient Call back at Home Phone [phone]   Caller: Patient Call For: Dion Sibal Reason for Call: Talk to Nurse Summary of Call: pt was given samples of Benicar -HCT 40mg /25 take 1/2 tablet daily.  Had to move his appt out a month and he will run out of samples.  Can he get enough samples to last him until 11/12/2009 when he see's PEW again? Initial call taken by: Eugene Gavia,  October 05, 2009 10:33 AM  Follow-up for Phone Call        called and spoke to pt and he wants to know if July is the soonest appt, Ilooked over PW schedule and schedule pt for appt on 10-23-09 at 2:50. I provided him with enough samples to last until this appt. pt aware of appt.Carron Curie CMA  October 05, 2009 11:08 AM

## 2010-05-28 NOTE — Assessment & Plan Note (Signed)
Summary: Admission History and Physical   Copy to:  Dr. Thayer Headings Primary Provider/Referring Provider:  Dr. Thayer Headings   History of Present Illness: Admission History and Physical  [name] with acute on chronic pulmonary embolism.  No prior hx other than HTN and elevated PSA  This patient's had a chronic cough  since January 2011.  Cough is productive of clear mucus. The patient does note postnasal drainage. The patient has been placed on nasal steroids without much improvement. Pulmonary functions had previously been obtained and were normal.  It is important to note the patient is on ACE inhibitor.  The pt was first seen by this MD 09/04/09 and I felt was primary cyclical cough with GERD.  However pt did have mildly low spo2.  No chest pain.  Cough always non productive.  No edema or LE symptoms. Pt seen in office in f/u 6/27 and still hypoxic and coughing. Stating could not get a deep breath and the berath was not helping him.   He desated with exertion.  We scheduled a CTA of chest 6/28.  I reorderd pulse steroids and started [facility] on the patient.    Pt today at CT scan found to have bilateral PE and thus pt admitted for further inpt care as direct admit for anticoagulation.      Preventive Screening-Counseling & Management  Alcohol-Tobacco     Smoking Status: never     Passive Smoke Exposure: yes  Current Medications (verified): 1)  Benicar Hct 20-12.5 Mg  Tabs (Olmesartan Medoxomil-Hctz) .... One Tablet By Mouth Daily 2)  Vitamin D .... Once Daily 3)  Vitamin B .... Once Daily 4)  Vitamin C .... Once Daily 5)  Tussicaps 10-8 Mg Xr12h-Cap (Hydrocod Polst-Chlorphen Polst) .... One By Mouth Two Times A Day As Needed Cough 6)  Omeprazole 20 Mg Cpdr (Omeprazole) .... Take 1 Tablet By Mouth Once A Day 7)  Prednisone 10 Mg  Tabs (Prednisone) .... Take As Directed 4 Each Am X3days, 3 X 3days, 2 X 3days, 1 X 3days Then Stop 8)  Dulera 200-5 Mcg/act Aero (Mometasone  Furo-Formoterol Fum) .... 2 Puffs Twice Per Day  Allergies (verified): No Known Drug Allergies  Past History:  Past medical, surgical, family and social histories (including risk factors) reviewed, and no changes noted (except as noted below).  Past Medical History: Reviewed history from 01/17/2008 and no changes required. elevated PSA   bx neg Hypertension  Past Surgical History: Reviewed history from 09/04/2009 and no changes required. prostate bx x 3  Family History: Reviewed history from 01/17/2008 and no changes required. father had HBP and died of stroke   71     ethoh  and poor diet  GF died  54 sibling  4 bros and sis good health.  no   colon cancer  no prostate cancer    Social History: Reviewed history from 09/04/2009 and no changes required. sleep better    now  ethoh   3-4 per week  caffeine    no recent exercise .    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  The patient denies shortness of breath at rest, productive cough, coughing up blood, chest pain, irregular heartbeats, acid heartburn, indigestion, loss of appetite, weight change, abdominal pain, difficulty swallowing, sore throat, tooth/dental problems, headaches, nasal congestion/difficulty breathing through nose,  sneezing, itching, ear ache, anxiety, depression, hand/feet swelling, joint stiffness or pain, rash, change in color of mucus, and fever.    Vital Signs:  Patient profile:   60 year old male O2 Sat:      100 % on 2 L/min Temp:     97 degrees F oral Pulse rate:   100 / minute Resp:     12 per minute BP sitting:   123 / 77  (right arm)  O2 Flow:  2 L/min  Physical Exam  General:  normal appearance and on supplemental oxygen.   Eyes:  PERRLA/EOM intact; conjunctiva and sclera clear Nose:  no deformity, discharge, inflammation, or lesions Mouth:  no deformity  or lesions Neck:  no masses, thyromegaly, or abnormal cervical nodes Chest Wall:  no deformities noted Lungs:  distant BS Heart:  regular rate and rhythm, S1, S2 without murmurs, rubs, gallops, or clicks  resting tachycardia Abdomen:  bowel sounds positive; abdomen soft and non-tender without masses, or organomegaly Msk:  no deformity or scoliosis noted with normal posture Pulses:  pulses normal Extremities:  no clubbing, cyanosis, edema, or deformity noted Neurologic:  CN II-XII grossly intact with normal reflexes, coordination, muscle strength and tone Skin:  intact without lesions or rashes Cervical Nodes:  no significant adenopathy Axillary Nodes:  no significant adenopathy Inguinal Nodes:  no significant adenopathy Psych:  alert and cooperative; normal mood and affect; normal attention span and concentration   Impression & Recommendations:  Problem # 1:  PULMONARY EMBOLISM (ICD-415.19) Assessment New Acute on chronic pulmonary embolism   suspect had chronic clots that then superimposed become acute with new clots.   Suspect hypercoag state,  suspect has had chronic embollii collecting for months plan IV hep drip today, convert to lovenox.  add coumadin 6/30  Complete Medication List: 1)  Benicar Hct 20-12.5 Mg Tabs (Olmesartan medoxomil-hctz) .... One tablet by mouth daily 2)  Vitamin D  .... Once daily 3)  Vitamin B  .... Once daily 4)  Vitamin C  .... Once daily 5)  Tussicaps 10-8 Mg Xr12h-cap (Hydrocod polst-chlorphen polst) .... One by mouth two times a day as needed cough 6)  Omeprazole 20 Mg Cpdr (Omeprazole) .... Take 1 tablet by mouth once a day 7)  Prednisone 10 Mg Tabs (Prednisone) .... Take as directed 4 each am x3days, 3 x 3days, 2 x 3days, 1 x 3days then stop 8)  Dulera 200-5 Mcg/act Aero (Mometasone furo-formoterol fum) .... 2 puffs twice per day  Appended Document: Admission History and Physical fax brian mackenzie  Appended Document: Admission History and  Physical fax brian Thea Silversmith

## 2010-07-14 LAB — BASIC METABOLIC PANEL
GFR calc Af Amer: 55 mL/min — ABNORMAL LOW (ref >60)
GFR calc non Af Amer: 46 mL/min — ABNORMAL LOW (ref >60)
Potassium: 4.1 mEq/L (ref 3.5–5.1)
Sodium: 139 mEq/L (ref 135–145)

## 2010-07-14 LAB — PROTIME-INR: Prothrombin Time: 14.7 seconds (ref 11.6–15.2)

## 2010-07-14 LAB — CBC
Platelets: 204 10*3/uL (ref 150–400)
RBC: 4.42 MIL/uL (ref 4.22–5.81)
RDW: 14.5 % (ref 11.5–15.5)
WBC: 7.2 10*3/uL (ref 4.0–10.5)

## 2010-07-15 LAB — BASIC METABOLIC PANEL
BUN: 18 mg/dL (ref 6–23)
CO2: 26 mEq/L (ref 19–32)
CO2: 26 mEq/L (ref 19–32)
Calcium: 8.5 mg/dL (ref 8.4–10.5)
Calcium: 9.1 mg/dL (ref 8.4–10.5)
Chloride: 105 mEq/L (ref 96–112)
Chloride: 105 mEq/L (ref 96–112)
Creatinine, Ser: 1.42 mg/dL (ref 0.4–1.5)
GFR calc Af Amer: 57 mL/min — ABNORMAL LOW (ref >60)
GFR calc Af Amer: >60 mL/min (ref >60)
Glucose, Bld: 100 mg/dL — ABNORMAL HIGH (ref 70–99)
Sodium: 140 mEq/L (ref 135–145)

## 2010-07-15 LAB — CBC
HCT: 38.9 % — ABNORMAL LOW (ref 39.0–52.0)
Hemoglobin: 13.3 g/dL (ref 13.0–17.0)
MCH: 29.8 pg (ref 26.0–34.0)
MCH: 29.9 pg (ref 26.0–34.0)
MCV: 86.4 fL (ref 78.0–100.0)
MCV: 87.5 fL (ref 78.0–100.0)
Platelets: 196 10*3/uL (ref 150–400)
Platelets: 226 10*3/uL (ref 150–400)
RBC: 4.45 MIL/uL (ref 4.22–5.81)
RBC: 4.93 MIL/uL (ref 4.22–5.81)
RDW: 14.3 % (ref 11.5–15.5)
WBC: 7.4 10*3/uL (ref 4.0–10.5)

## 2010-07-15 LAB — LUPUS ANTICOAGULANT PANEL
Drvvt confirmation: 1.54 Ratio — ABNORMAL HIGH (ref <1.21)
Lupus Anticoagulant: DETECTED — AB
PTTLA Confirmation: 0.9 secs (ref <8.0)
dRVVT Incubated 1:1 Mix: 45.1 secs — ABNORMAL HIGH (ref 36.2–44.3)

## 2010-07-15 LAB — CARDIAC PANEL(CRET KIN+CKTOT+MB+TROPI)
CK, MB: 0.9 ng/mL (ref 0.3–4.0)
Relative Index: INVALID (ref 0.0–2.5)
Relative Index: INVALID (ref 0.0–2.5)
Relative Index: INVALID (ref 0.0–2.5)
Total CK: 54 U/L (ref 7–232)
Total CK: 58 U/L (ref 7–232)
Troponin I: 0.01 ng/mL (ref 0.00–0.06)
Troponin I: 0.01 ng/mL (ref 0.00–0.06)

## 2010-07-15 LAB — BETA-2-GLYCOPROTEIN I ABS, IGG/M/A
Beta-2 Glyco I IgG: 1 G Units (ref <20)
Beta-2-Glycoprotein I IgA: 15 A Units (ref <20)

## 2010-07-15 LAB — BRAIN NATRIURETIC PEPTIDE: Pro B Natriuretic peptide (BNP): <30 pg/mL (ref 0.0–100.0)

## 2010-07-15 LAB — PHOSPHORUS: Phosphorus: 3.6 mg/dL (ref 2.3–4.6)

## 2010-07-15 LAB — CARDIOLIPIN ANTIBODIES, IGG, IGM, IGA: Anticardiolipin IgA: 6 APL U/mL — ABNORMAL LOW (ref <22)

## 2010-07-15 LAB — PROTHROMBIN GENE MUTATION

## 2010-07-15 LAB — PROTIME-INR
INR: 1.15 (ref 0.00–1.49)
Prothrombin Time: 14.6 seconds (ref 11.6–15.2)

## 2010-07-15 LAB — FACTOR 5 LEIDEN

## 2010-07-15 LAB — MRSA PCR SCREENING: MRSA by PCR: NEGATIVE

## 2010-07-15 LAB — APTT: aPTT: 29 seconds (ref 24–37)

## 2010-07-15 LAB — PROTEIN C, TOTAL: Protein C, Total: 86 % (ref 70–140)

## 2010-07-15 LAB — MAGNESIUM: Magnesium: 2.2 mg/dL (ref 1.5–2.5)

## 2011-05-17 ENCOUNTER — Telehealth: Payer: Self-pay | Admitting: Oncology

## 2011-05-17 NOTE — Telephone Encounter (Signed)
appt made and pt called for 06/24/11 appt/conversion  aom

## 2011-06-26 ENCOUNTER — Ambulatory Visit: Payer: Self-pay | Admitting: Oncology

## 2012-01-04 IMAGING — CT CT ANGIO CHEST
2 of 5 series · 19 of 46 positions shown · IV contrast (Omnipaque 300)
Comparison: None.

CLINICAL DATA: Shortness of breath, cough

CT ANGIOGRAPHY CHEST WITH CONTRAST
TECHNIQUE: Multidetector CT imaging of the chest was performed
using the standard protocol during bolus administration of
intravenous contrast.  Multiplanar CT image reconstructions
including MIPs were obtained to evaluate the vascular anatomy.
Contrast:  80 ml Emnipaque-FJJ IV

[Series 5: thins (id) / (id) · axial · 0.77mm/px · z∈[-313,-44]mm · 16 of 303 slices shown]
[im 17/303  lung]
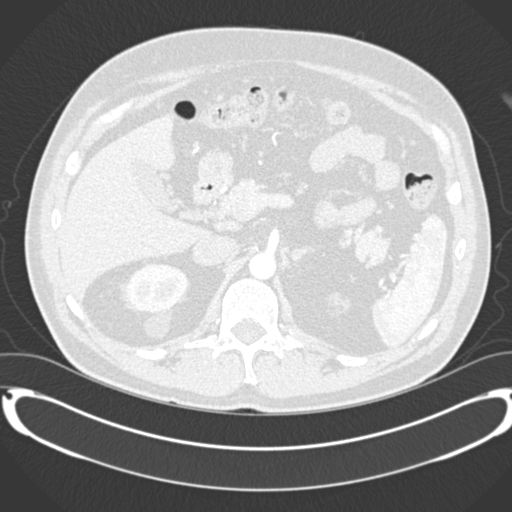
[im 34/303  soft-tissue]
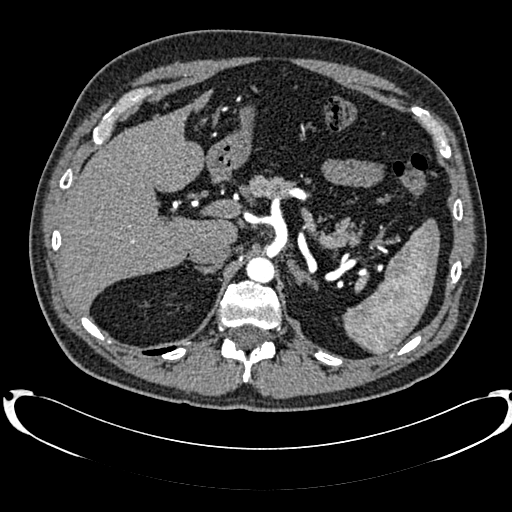
[im 51/303  lung]
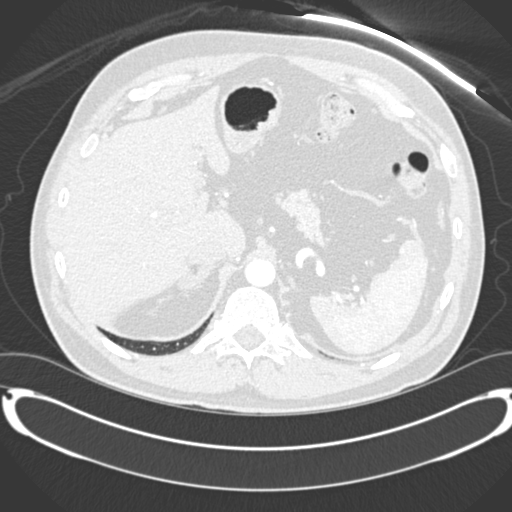
[im 68/303  soft-tissue]
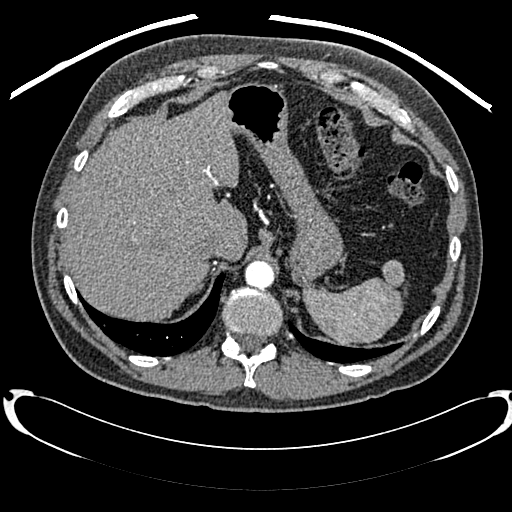
[im 84/303  lung]
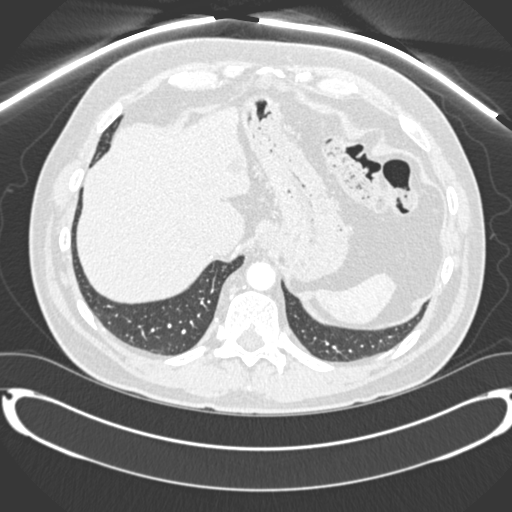
[im 101/303  soft-tissue]
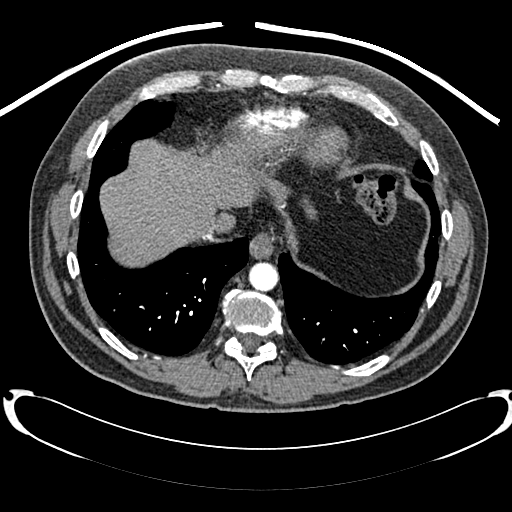
[im 118/303  lung]
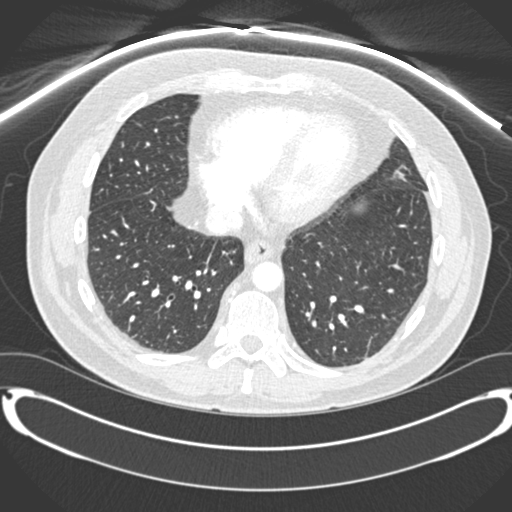
[im 135/303  soft-tissue]
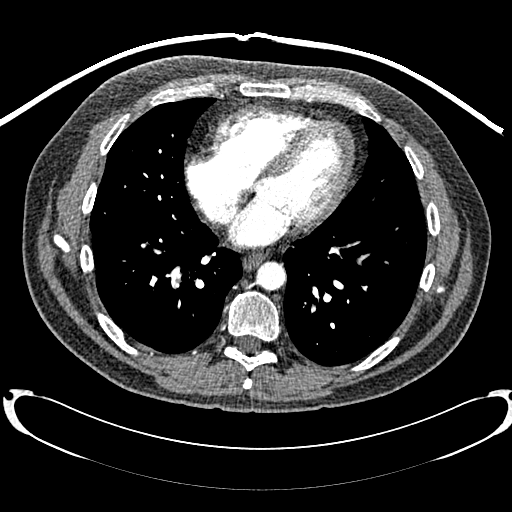
[im 168/303  lung]
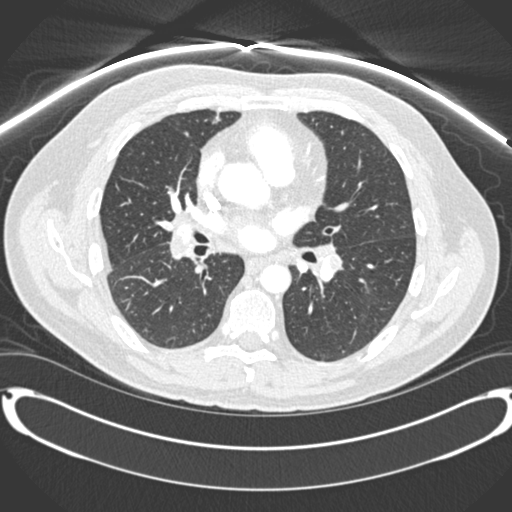
[im 185/303  soft-tissue]
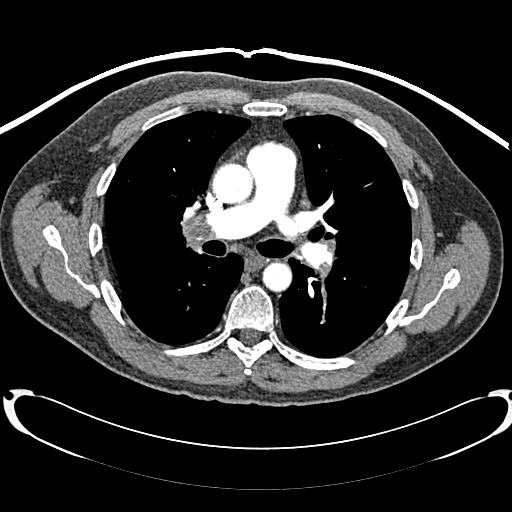
[im 202/303  lung]
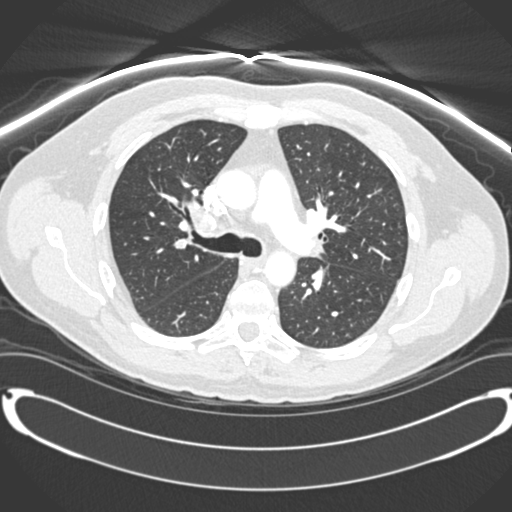
[im 219/303  soft-tissue]
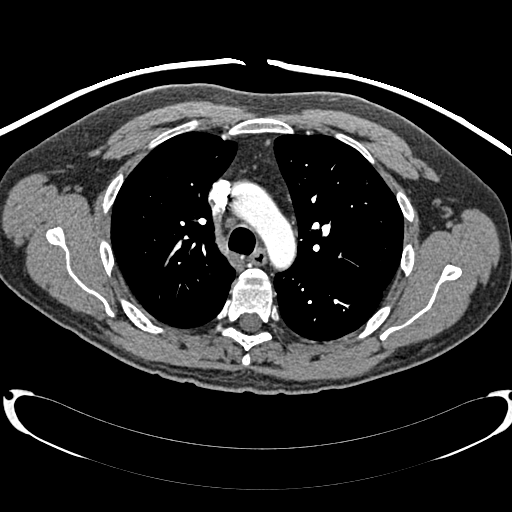
[im 235/303  lung]
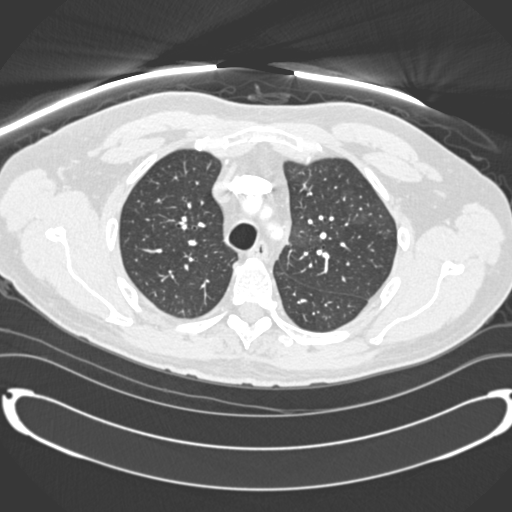
[im 252/303  soft-tissue]
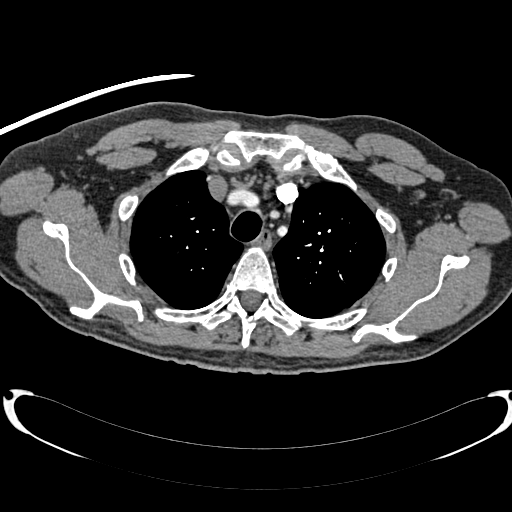
[im 269/303  lung]
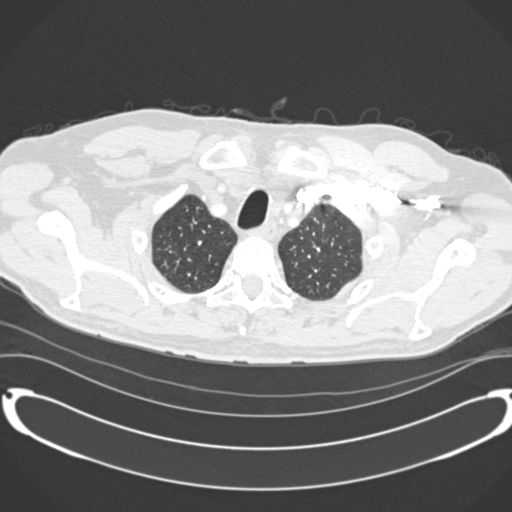
[im 286/303  soft-tissue]
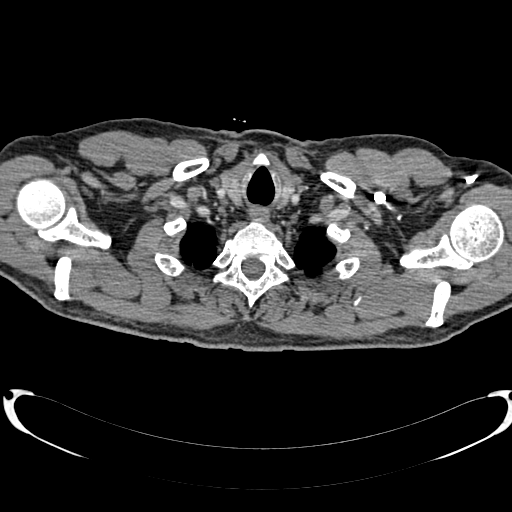

[Series 602: <mpr thick range> · coronal · 0.77mm/px · 3 of 133 slices shown]
[im 45/133  soft-tissue]
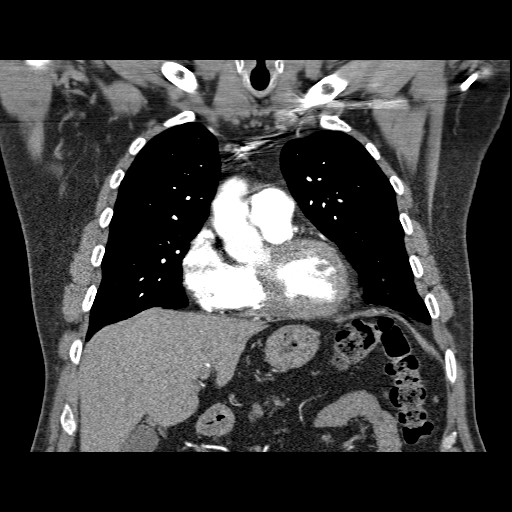
[im 59/133  soft-tissue]
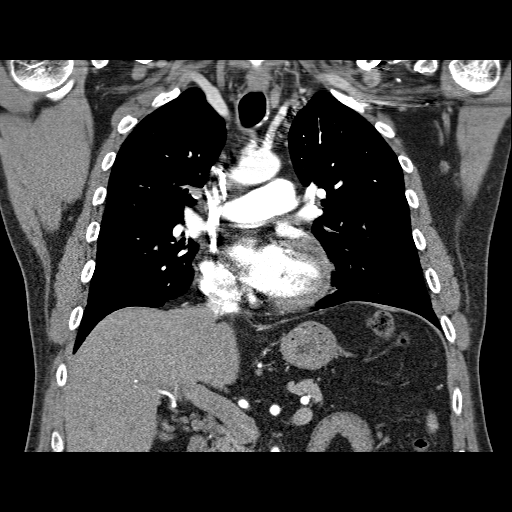
[im 74/133  soft-tissue]
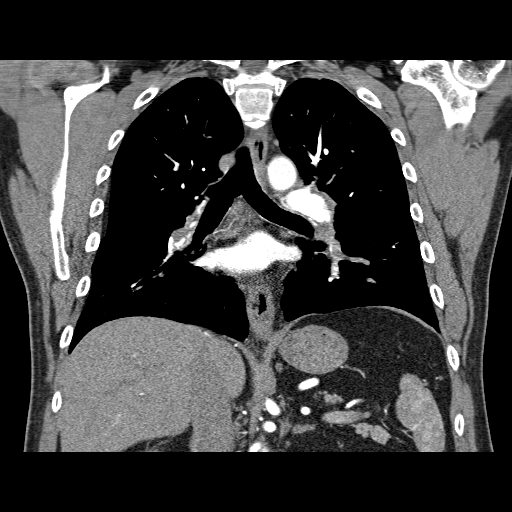

[19 of 46 positions shown; findings below may reference images not displayed]

FINDINGS: There is bilateral PE with near occlusive thrombus at
the bifurcation of the right main pulmonary artery multiple
proximal segmental occlusive emboli on the left involving primarily
lingular and lower lobe branches.

There is no pleural or pericardial effusion.  There is no definite
intraventricular septal deviation.  There are normal sized
subcentimeter precarinal and prevascular lymph nodes.  No hilar
adenopathy.

There are some patchy nonspecific ground-glass opacities in the
superior segment left lower lobe.  Lungs otherwise clear.

In the visualized upper abdomen, there is a 16 mm probable hepatic
cyst in the left lobe near the dome, image 72/4.  There is an
exophytic 2.8 cm probable simple cyst from the upper pole right
kidney, incompletely visualized.

Review of the MIP images confirms the above findings.
IMPRESSION: 1.  Bilateral central pulmonary emboli. I telephoned the critical
test results to [HOSPITAL] RN in Dr. [REDACTED] at the time of
interpretation.

## 2014-01-25 ENCOUNTER — Encounter: Payer: Self-pay | Admitting: Gastroenterology

## 2014-09-06 ENCOUNTER — Encounter: Payer: Self-pay | Admitting: Gastroenterology

## 2014-11-07 ENCOUNTER — Encounter: Payer: Self-pay | Admitting: Gastroenterology

## 2017-09-08 DIAGNOSIS — Z85828 Personal history of other malignant neoplasm of skin: Secondary | ICD-10-CM | POA: Diagnosis not present

## 2017-09-08 DIAGNOSIS — L905 Scar conditions and fibrosis of skin: Secondary | ICD-10-CM | POA: Diagnosis not present

## 2017-09-09 DIAGNOSIS — R69 Illness, unspecified: Secondary | ICD-10-CM | POA: Diagnosis not present

## 2017-09-22 DIAGNOSIS — R69 Illness, unspecified: Secondary | ICD-10-CM | POA: Diagnosis not present

## 2017-10-19 DIAGNOSIS — E559 Vitamin D deficiency, unspecified: Secondary | ICD-10-CM | POA: Diagnosis not present

## 2017-10-19 DIAGNOSIS — Z125 Encounter for screening for malignant neoplasm of prostate: Secondary | ICD-10-CM | POA: Diagnosis not present

## 2017-10-19 DIAGNOSIS — E785 Hyperlipidemia, unspecified: Secondary | ICD-10-CM | POA: Diagnosis not present

## 2017-10-19 DIAGNOSIS — I129 Hypertensive chronic kidney disease with stage 1 through stage 4 chronic kidney disease, or unspecified chronic kidney disease: Secondary | ICD-10-CM | POA: Diagnosis not present

## 2017-10-19 DIAGNOSIS — E039 Hypothyroidism, unspecified: Secondary | ICD-10-CM | POA: Diagnosis not present

## 2017-10-26 DIAGNOSIS — Z86711 Personal history of pulmonary embolism: Secondary | ICD-10-CM | POA: Diagnosis not present

## 2017-10-26 DIAGNOSIS — E559 Vitamin D deficiency, unspecified: Secondary | ICD-10-CM | POA: Diagnosis not present

## 2017-10-26 DIAGNOSIS — Z0000 Encounter for general adult medical examination without abnormal findings: Secondary | ICD-10-CM | POA: Diagnosis not present

## 2017-10-26 DIAGNOSIS — E039 Hypothyroidism, unspecified: Secondary | ICD-10-CM | POA: Diagnosis not present

## 2017-10-26 DIAGNOSIS — Z7901 Long term (current) use of anticoagulants: Secondary | ICD-10-CM | POA: Diagnosis not present

## 2017-10-26 DIAGNOSIS — R790 Abnormal level of blood mineral: Secondary | ICD-10-CM | POA: Diagnosis not present

## 2017-10-26 DIAGNOSIS — I10 Essential (primary) hypertension: Secondary | ICD-10-CM | POA: Diagnosis not present

## 2017-10-26 DIAGNOSIS — Z23 Encounter for immunization: Secondary | ICD-10-CM | POA: Diagnosis not present

## 2017-10-26 DIAGNOSIS — N183 Chronic kidney disease, stage 3 (moderate): Secondary | ICD-10-CM | POA: Diagnosis not present

## 2017-10-26 DIAGNOSIS — R69 Illness, unspecified: Secondary | ICD-10-CM | POA: Diagnosis not present

## 2018-03-09 DIAGNOSIS — H524 Presbyopia: Secondary | ICD-10-CM | POA: Diagnosis not present

## 2018-04-16 DIAGNOSIS — L819 Disorder of pigmentation, unspecified: Secondary | ICD-10-CM | POA: Diagnosis not present

## 2018-04-16 DIAGNOSIS — L814 Other melanin hyperpigmentation: Secondary | ICD-10-CM | POA: Diagnosis not present

## 2018-04-16 DIAGNOSIS — D229 Melanocytic nevi, unspecified: Secondary | ICD-10-CM | POA: Diagnosis not present

## 2018-04-16 DIAGNOSIS — L570 Actinic keratosis: Secondary | ICD-10-CM | POA: Diagnosis not present

## 2018-04-16 DIAGNOSIS — L218 Other seborrheic dermatitis: Secondary | ICD-10-CM | POA: Diagnosis not present

## 2018-04-16 DIAGNOSIS — L821 Other seborrheic keratosis: Secondary | ICD-10-CM | POA: Diagnosis not present

## 2018-04-16 DIAGNOSIS — D1801 Hemangioma of skin and subcutaneous tissue: Secondary | ICD-10-CM | POA: Diagnosis not present

## 2018-04-16 DIAGNOSIS — B353 Tinea pedis: Secondary | ICD-10-CM | POA: Diagnosis not present

## 2018-04-29 DIAGNOSIS — Z125 Encounter for screening for malignant neoplasm of prostate: Secondary | ICD-10-CM | POA: Diagnosis not present

## 2018-04-29 DIAGNOSIS — E559 Vitamin D deficiency, unspecified: Secondary | ICD-10-CM | POA: Diagnosis not present

## 2018-04-29 DIAGNOSIS — I10 Essential (primary) hypertension: Secondary | ICD-10-CM | POA: Diagnosis not present

## 2018-04-29 DIAGNOSIS — E785 Hyperlipidemia, unspecified: Secondary | ICD-10-CM | POA: Diagnosis not present

## 2018-04-29 DIAGNOSIS — E039 Hypothyroidism, unspecified: Secondary | ICD-10-CM | POA: Diagnosis not present

## 2018-04-30 DIAGNOSIS — E559 Vitamin D deficiency, unspecified: Secondary | ICD-10-CM | POA: Diagnosis not present

## 2018-04-30 DIAGNOSIS — Z86711 Personal history of pulmonary embolism: Secondary | ICD-10-CM | POA: Diagnosis not present

## 2018-04-30 DIAGNOSIS — N183 Chronic kidney disease, stage 3 (moderate): Secondary | ICD-10-CM | POA: Diagnosis not present

## 2018-04-30 DIAGNOSIS — Z7901 Long term (current) use of anticoagulants: Secondary | ICD-10-CM | POA: Diagnosis not present

## 2018-04-30 DIAGNOSIS — N400 Enlarged prostate without lower urinary tract symptoms: Secondary | ICD-10-CM | POA: Diagnosis not present

## 2018-04-30 DIAGNOSIS — E039 Hypothyroidism, unspecified: Secondary | ICD-10-CM | POA: Diagnosis not present

## 2018-04-30 DIAGNOSIS — E785 Hyperlipidemia, unspecified: Secondary | ICD-10-CM | POA: Diagnosis not present

## 2018-04-30 DIAGNOSIS — I129 Hypertensive chronic kidney disease with stage 1 through stage 4 chronic kidney disease, or unspecified chronic kidney disease: Secondary | ICD-10-CM | POA: Diagnosis not present

## 2018-05-31 DIAGNOSIS — R972 Elevated prostate specific antigen [PSA]: Secondary | ICD-10-CM | POA: Diagnosis not present

## 2018-06-03 DIAGNOSIS — R972 Elevated prostate specific antigen [PSA]: Secondary | ICD-10-CM | POA: Diagnosis not present

## 2018-06-03 DIAGNOSIS — N400 Enlarged prostate without lower urinary tract symptoms: Secondary | ICD-10-CM | POA: Diagnosis not present

## 2018-11-08 DIAGNOSIS — I129 Hypertensive chronic kidney disease with stage 1 through stage 4 chronic kidney disease, or unspecified chronic kidney disease: Secondary | ICD-10-CM | POA: Diagnosis not present

## 2018-11-08 DIAGNOSIS — E785 Hyperlipidemia, unspecified: Secondary | ICD-10-CM | POA: Diagnosis not present

## 2018-11-08 DIAGNOSIS — E039 Hypothyroidism, unspecified: Secondary | ICD-10-CM | POA: Diagnosis not present

## 2018-11-08 DIAGNOSIS — N183 Chronic kidney disease, stage 3 (moderate): Secondary | ICD-10-CM | POA: Diagnosis not present

## 2018-11-15 DIAGNOSIS — Z7901 Long term (current) use of anticoagulants: Secondary | ICD-10-CM | POA: Diagnosis not present

## 2018-11-15 DIAGNOSIS — E039 Hypothyroidism, unspecified: Secondary | ICD-10-CM | POA: Diagnosis not present

## 2018-11-15 DIAGNOSIS — N183 Chronic kidney disease, stage 3 (moderate): Secondary | ICD-10-CM | POA: Diagnosis not present

## 2018-11-15 DIAGNOSIS — I10 Essential (primary) hypertension: Secondary | ICD-10-CM | POA: Diagnosis not present

## 2018-11-15 DIAGNOSIS — Z86711 Personal history of pulmonary embolism: Secondary | ICD-10-CM | POA: Diagnosis not present

## 2018-11-15 DIAGNOSIS — Z7189 Other specified counseling: Secondary | ICD-10-CM | POA: Diagnosis not present

## 2018-11-15 DIAGNOSIS — R7301 Impaired fasting glucose: Secondary | ICD-10-CM | POA: Diagnosis not present

## 2018-11-15 DIAGNOSIS — Z0000 Encounter for general adult medical examination without abnormal findings: Secondary | ICD-10-CM | POA: Diagnosis not present

## 2019-03-09 DIAGNOSIS — R972 Elevated prostate specific antigen [PSA]: Secondary | ICD-10-CM | POA: Diagnosis not present

## 2019-03-15 DIAGNOSIS — N400 Enlarged prostate without lower urinary tract symptoms: Secondary | ICD-10-CM | POA: Diagnosis not present

## 2019-03-15 DIAGNOSIS — R972 Elevated prostate specific antigen [PSA]: Secondary | ICD-10-CM | POA: Diagnosis not present

## 2019-04-18 DIAGNOSIS — L819 Disorder of pigmentation, unspecified: Secondary | ICD-10-CM | POA: Diagnosis not present

## 2019-04-18 DIAGNOSIS — D1801 Hemangioma of skin and subcutaneous tissue: Secondary | ICD-10-CM | POA: Diagnosis not present

## 2019-04-18 DIAGNOSIS — Z85828 Personal history of other malignant neoplasm of skin: Secondary | ICD-10-CM | POA: Diagnosis not present

## 2019-04-18 DIAGNOSIS — D229 Melanocytic nevi, unspecified: Secondary | ICD-10-CM | POA: Diagnosis not present

## 2019-04-18 DIAGNOSIS — L821 Other seborrheic keratosis: Secondary | ICD-10-CM | POA: Diagnosis not present

## 2019-04-18 DIAGNOSIS — L814 Other melanin hyperpigmentation: Secondary | ICD-10-CM | POA: Diagnosis not present

## 2019-05-11 DIAGNOSIS — N183 Chronic kidney disease, stage 3 unspecified: Secondary | ICD-10-CM | POA: Diagnosis not present

## 2019-05-11 DIAGNOSIS — Z7189 Other specified counseling: Secondary | ICD-10-CM | POA: Diagnosis not present

## 2019-05-11 DIAGNOSIS — Z0000 Encounter for general adult medical examination without abnormal findings: Secondary | ICD-10-CM | POA: Diagnosis not present

## 2019-05-11 DIAGNOSIS — R7301 Impaired fasting glucose: Secondary | ICD-10-CM | POA: Diagnosis not present

## 2019-05-11 DIAGNOSIS — I10 Essential (primary) hypertension: Secondary | ICD-10-CM | POA: Diagnosis not present

## 2019-05-11 DIAGNOSIS — E039 Hypothyroidism, unspecified: Secondary | ICD-10-CM | POA: Diagnosis not present

## 2019-05-18 DIAGNOSIS — Z13220 Encounter for screening for lipoid disorders: Secondary | ICD-10-CM | POA: Diagnosis not present

## 2019-05-18 DIAGNOSIS — Z23 Encounter for immunization: Secondary | ICD-10-CM | POA: Diagnosis not present

## 2019-05-18 DIAGNOSIS — Z86711 Personal history of pulmonary embolism: Secondary | ICD-10-CM | POA: Diagnosis not present

## 2019-05-18 DIAGNOSIS — R972 Elevated prostate specific antigen [PSA]: Secondary | ICD-10-CM | POA: Diagnosis not present

## 2019-05-18 DIAGNOSIS — Z7189 Other specified counseling: Secondary | ICD-10-CM | POA: Diagnosis not present

## 2019-05-18 DIAGNOSIS — Z7901 Long term (current) use of anticoagulants: Secondary | ICD-10-CM | POA: Diagnosis not present

## 2019-05-18 DIAGNOSIS — N400 Enlarged prostate without lower urinary tract symptoms: Secondary | ICD-10-CM | POA: Diagnosis not present

## 2019-05-18 DIAGNOSIS — E039 Hypothyroidism, unspecified: Secondary | ICD-10-CM | POA: Diagnosis not present

## 2019-05-18 DIAGNOSIS — N183 Chronic kidney disease, stage 3 unspecified: Secondary | ICD-10-CM | POA: Diagnosis not present

## 2019-05-18 DIAGNOSIS — I129 Hypertensive chronic kidney disease with stage 1 through stage 4 chronic kidney disease, or unspecified chronic kidney disease: Secondary | ICD-10-CM | POA: Diagnosis not present

## 2019-07-11 ENCOUNTER — Ambulatory Visit: Payer: Self-pay | Admitting: Gastroenterology

## 2019-08-11 ENCOUNTER — Telehealth: Payer: Self-pay

## 2019-08-11 ENCOUNTER — Ambulatory Visit: Payer: Medicare HMO | Admitting: Gastroenterology

## 2019-08-11 ENCOUNTER — Encounter: Payer: Self-pay | Admitting: Gastroenterology

## 2019-08-11 VITALS — BP 108/78 | HR 68 | Temp 98.2°F | Ht 69.25 in | Wt 212.0 lb

## 2019-08-11 DIAGNOSIS — Z7901 Long term (current) use of anticoagulants: Secondary | ICD-10-CM

## 2019-08-11 DIAGNOSIS — Z86010 Personal history of colonic polyps: Secondary | ICD-10-CM

## 2019-08-11 DIAGNOSIS — Z860101 Personal history of adenomatous and serrated colon polyps: Secondary | ICD-10-CM

## 2019-08-11 MED ORDER — NA SULFATE-K SULFATE-MG SULF 17.5-3.13-1.6 GM/177ML PO SOLN: 1.0000 | Freq: Once | ORAL | 0 refills | Status: AC

## 2019-08-11 NOTE — Patient Instructions (Signed)
You have been scheduled for a colonoscopy. Please follow written instructions given to you at your visit today.  Please pick up your prep supplies at the pharmacy within the next 1-3 days. If you use inhalers (even only as needed), please bring them with you on the day of your procedure.  Thank you for choosing me and [facility].  Malcolm T. Stark, Jr., MD., FACG  

## 2019-08-11 NOTE — Telephone Encounter (Signed)
Faxed clearance letter to Dr. Theda Sers office to get approval of patient coming off coumadin 5 days prior to his scheduled Colonoscopy on 09/07/19.

## 2019-08-11 NOTE — Progress Notes (Signed)
History of Present Illness: This is a 69 year old male referred by Janie Morning, MD for the evaluation of a personal history of colon polyps. He is maintain on warfarin for a history of a PE. Colonoscopy in 2006 showed a small adenomatous colon polyp. Colonoscopy in 2010 with an incomplete polypectomy which was inflamed granulation tissue.  He has no ongoing gastrointestinal complaints except for brief, infrequent episodes of heartburn. Denies weight loss, abdominal pain, constipation, diarrhea, change in stool caliber, melena, hematochezia, nausea, vomiting, dysphagia, chest pain.     No Known Allergies Outpatient Medications Prior to Visit  Medication Sig Dispense Refill  . Ascorbic Acid (VITAMIN C) 1000 MG tablet Take 1,000 mg by mouth daily.    . B Complex Vitamins (B COMPLEX PO) Take 1 tablet by mouth daily.    . Cholecalciferol (VITAMIN D3 PO) Take 1 tablet by mouth daily.    Marland Kitchen losartan (COZAAR) 100 MG tablet Take 100 mg by mouth daily.    Marland Kitchen MAGNESIUM PO Take 1 tablet by mouth daily.    Marland Kitchen warfarin (COUMADIN) 5 MG tablet 5 mg by mouth every day 7.5 on Wed     No facility-administered medications prior to visit.   Past Medical History:  Diagnosis Date  . Adenomatous polyp of colon 10/2004  . BPH (benign prostatic hyperplasia)   . Chronic kidney disease, stage 3   . Deep vein thrombosis (DVT) ([place])   . Diverticulosis   . Hypertension   . Hypothyroidism   . Pulmonary embolism ([place])   . Pure hypercholesterolemia   . Vitamin D deficiency    Past Surgical History:  Procedure Laterality Date  . NO PAST SURGERIES     Social History   Socioeconomic History  . Marital status: Married    Spouse name: Not on file  . Number of children: 2  . Years of education: Not on file  . Highest education level: Not on file  Occupational History  . Occupation: retired  Tobacco Use  . Smoking status: Never Smoker  . Smokeless tobacco: Never Used  Substance and Sexual Activity  .  Alcohol use: Yes    Comment: 1 per day  . Drug use: Never  . Sexual activity: Not on file  Other Topics Concern  . Not on file  Social History Narrative  . Not on file   Social Determinants of Health   Financial Resource Strain:   . Difficulty of Paying Living Expenses:   Food Insecurity:   . Worried About Charity fundraiser in the Last Year:   . Arboriculturist in the Last Year:   Transportation Needs:   . Film/video editor (Medical):   Marland Kitchen Lack of Transportation (Non-Medical):   Physical Activity:   . Days of Exercise per Week:   . Minutes of Exercise per Session:   Stress:   . Feeling of Stress :   Social Connections:   . Frequency of Communication with Friends and Family:   . Frequency of Social Gatherings with Friends and Family:   . Attends Religious Services:   . Active Member of Clubs or Organizations:   . Attends Archivist Meetings:   Marland Kitchen Marital Status:    Family History  Problem Relation Age of Onset  . Stroke Father   . Hypertension Father   . Heart attack Brother   . Heart disease Maternal Grandfather   . Diabetes Paternal Grandmother   . Heart disease Paternal Grandfather  Review of Systems: Pertinent positive and negative review of systems were noted in the above HPI section. All other review of systems were otherwise negative.   Physical Exam: General: Well developed, well nourished, no acute distress Head: Normocephalic and atraumatic Eyes:  sclerae anicteric, EOMI Ears: Normal auditory acuity Mouth: Not examined, mask on during Covid-19 pandemic Neck: Supple, no masses or thyromegaly Lungs: Clear throughout to auscultation Heart: Regular rate and rhythm; no murmurs, rubs or bruits Abdomen: Soft, non tender and non distended. No masses, hepatosplenomegaly or hernias noted. Normal Bowel sounds Rectal: Deferred to colonoscopy  Musculoskeletal: Symmetrical with no gross deformities  Skin: No lesions on visible extremities Pulses:   Normal pulses noted Extremities: No clubbing, cyanosis, edema or deformities noted Neurological: Alert oriented x 4, grossly nonfocal Cervical Nodes:  No significant cervical adenopathy Inguinal Nodes: No significant inguinal adenopathy Psychological:  Alert and cooperative. Normal mood and affect   Assessment and Recommendations:  1.  Personal history of small adenomatous colon polyp.  Schedule colonoscopy. The risks (including bleeding, perforation, infection, missed lesions, medication reactions and possible hospitalization or surgery if complications occur), benefits, and alternatives to colonoscopy with possible biopsy and possible polypectomy were discussed with the patient and they consent to proceed.   2. History of pulmonary embolism. Hold warfarin 5 days before procedure - will instruct when and how to resume after procedure. Low but real risk of cardiovascular event such as heart attack, stroke, embolism, thrombosis or ischemia/infarct of other organs off warfarin explained and need to seek urgent help if this occurs. The patient consents to proceed. Will communicate by phone or EMR with patient's prescribing provider to confirm that holding warfarin is reasonable in this case.     cc: Janie Morning, MD

## 2019-08-12 NOTE — Telephone Encounter (Signed)
Received fax from Dr. Theda Sers confirming patient can hold coumadin 5 days prior to his procedures. Informed patient to hold coumadin 5 days prior to procedure and patient verbalized understanding.

## 2019-09-01 ENCOUNTER — Encounter: Payer: Self-pay | Admitting: Gastroenterology

## 2019-09-01 ENCOUNTER — Telehealth: Payer: Self-pay | Admitting: Gastroenterology

## 2019-09-01 MED ORDER — NA SULFATE-K SULFATE-MG SULF 17.5-3.13-1.6 GM/177ML PO SOLN: 1.0000 | Freq: Once | ORAL | 0 refills | Status: AC

## 2019-09-01 NOTE — Telephone Encounter (Signed)
Patient states that our office never called him regarding his coumadin clearance. Informed patient that him and I spoke on 08/12/19 and I informed he can hold his coumadin per Dr. Theda Sers 5 days prior to his procedure. Patient apologized and states he does not remember talking to me. Also patient states the prep was not called into the pharmacy. Informed patient we also sent that to the pharmacy the day of his appt with Dr. Fuller Plan but I do not mind sending in the prescription again. Patient verbalized understanding and will call back if the pharmacy does not receive it.

## 2019-09-07 ENCOUNTER — Ambulatory Visit (AMBULATORY_SURGERY_CENTER): Payer: Medicare HMO | Admitting: Gastroenterology

## 2019-09-07 ENCOUNTER — Encounter: Payer: Self-pay | Admitting: Gastroenterology

## 2019-09-07 ENCOUNTER — Other Ambulatory Visit: Payer: Self-pay

## 2019-09-07 VITALS — BP 108/69 | HR 64 | Temp 97.1°F | Resp 17 | Ht 69.0 in | Wt 212.0 lb

## 2019-09-07 DIAGNOSIS — Z86010 Personal history of colonic polyps: Secondary | ICD-10-CM

## 2019-09-07 DIAGNOSIS — D125 Benign neoplasm of sigmoid colon: Secondary | ICD-10-CM

## 2019-09-07 DIAGNOSIS — D214 Benign neoplasm of connective and other soft tissue of abdomen: Secondary | ICD-10-CM | POA: Diagnosis not present

## 2019-09-07 DIAGNOSIS — D123 Benign neoplasm of transverse colon: Secondary | ICD-10-CM

## 2019-09-07 MED ORDER — SODIUM CHLORIDE 0.9 % IV SOLN: 500.0000 mL | Freq: Once | INTRAVENOUS | Status: DC

## 2019-09-07 NOTE — Op Note (Addendum)
[facility] Patient Name: Hunter Sandoval Procedure Date: 09/07/2019 8:24 AM MRN: [id number] Endoscopist: Ladene Artist , MD Age: 69 Referring MD:  Date of Birth: 1951/03/27 Gender: Male Account #: [id number] Procedure:                Colonoscopy Indications:              Surveillance: Personal history of adenomatous                            polyps on last colonoscopy > 5 years ago Medicines:                Monitored Anesthesia Care Procedure:                Pre-Anesthesia Assessment:                           - Prior to the procedure, a History and Physical                            was performed, and patient medications and                            allergies were reviewed. The patient's tolerance of                            previous anesthesia was also reviewed. The risks                            and benefits of the procedure and the sedation                            options and risks were discussed with the patient.                            All questions were answered, and informed consent                            was obtained. Prior Anticoagulants: The patient has                            taken Coumadin (warfarin), last dose was 5 days                            prior to procedure. ASA Grade Assessment: II - A                            patient with mild systemic disease. After reviewing                            the risks and benefits, the patient was deemed in                            satisfactory condition to undergo the procedure.  After obtaining informed consent, the colonoscope                            was passed under direct vision. Throughout the                            procedure, the patient's blood pressure, pulse, and                            oxygen saturations were monitored continuously. The                            Colonoscope was introduced through the anus and                            advanced to the  the cecum, identified by                            appendiceal orifice and ileocecal valve. The                            ileocecal valve, appendiceal orifice, and rectum                            were photographed. The quality of the bowel                            preparation was good. The colonoscopy was performed                            without difficulty. The patient tolerated the                            procedure well. Scope In: 8:27:14 AM Scope Out: 8:49:09 AM Scope Withdrawal Time: 0 hours 20 minutes 2 seconds  Total Procedure Duration: 0 hours 21 minutes 55 seconds  Findings:                 The perianal and digital rectal examinations were                            normal.                           A 6 mm polyp was found in the transverse colon. The                            polyp was sessile. The polyp was removed with a                            cold snare. Resection and retrieval were complete.                           A 9 mm polyp was found in the sigmoid colon. The  polyp was pedunculated. The polyp was removed with                            a cold snare. Resection and retrieval were complete.                           Multiple medium-mouthed diverticula were found in                            the left colon. There was narrowing of the colon in                            association with the diverticular opening. There                            was evidence of diverticular spasm. There was no                            evidence of diverticular bleeding.                           A tattoo was seen in the sigmoid colon. The tattoo                            site appeared normal.                           Internal hemorrhoids were found during                            retroflexion. The hemorrhoids were small and Grade                            I (internal hemorrhoids that do not prolapse).                           The exam was  otherwise without abnormality on                            direct and retroflexion views. Complications:            No immediate complications. Estimated blood loss:                            None. Estimated Blood Loss:     Estimated blood loss: none. Impression:               - One 6 mm polyp in the transverse colon, removed                            with a cold snare. Resected and retrieved.                           - One 9 mm polyp in the sigmoid colon, removed with  a cold snare. Resected and retrieved.                           - Moderate diverticulosis in the left colon.                           - A tattoo was seen in the sigmoid colon. The                            tattoo site appeared normal.                           - Internal hemorrhoids.                           - The examination was otherwise normal on direct                            and retroflexion views. Recommendation:           - Repeat colonoscopy after studies are complete for                            surveillance based on pathology results.                           - Resume Coumadin (warfarin) in 2 days at prior                            dose. Refer to managing physician for further                            adjustment of therapy.                           - Patient has a contact number available for                            emergencies. The signs and symptoms of potential                            delayed complications were discussed with the                            patient. Return to normal activities tomorrow.                            Written discharge instructions were provided to the                            patient.                           - Resume previous diet.                           -  Continue present medications.                           - Await pathology results.                           - No aspirin, ibuprofen, naproxen, or other                             non-steroidal anti-inflammatory drugs for 2 weeks                            after polyp removal. Ladene Artist, MD 09/07/2019 8:56:37 AM This report has been signed electronically.

## 2019-09-07 NOTE — Progress Notes (Signed)
Called to room to assist during endoscopic procedure.  Patient ID and intended procedure confirmed with present staff. Received instructions for my participation in the procedure from the performing physician.  

## 2019-09-07 NOTE — Progress Notes (Signed)
A/ox3, pleased with MAC, report to RN 

## 2019-09-07 NOTE — Patient Instructions (Signed)
Handouts Provided:  Polyps and Diverticulosis  RESTART Coumadin in 2 days at prior dose.   NO aspirin, ibuprofen, naproxen, or other non-steroidal anti-inflammatory drugs for 2 weeks after polyp removal.   YOU HAD AN ENDOSCOPIC PROCEDURE TODAY AT [facility]:   Refer to the procedure report that was given to you for any specific questions about what was found during the examination.  If the procedure report does not answer your questions, please call your gastroenterologist to clarify.  If you requested that your care partner not be given the details of your procedure findings, then the procedure report has been included in a sealed envelope for you to review at your convenience later.  YOU SHOULD EXPECT: Some feelings of bloating in the abdomen. Passage of more gas than usual.  Walking can help get rid of the air that was put into your GI tract during the procedure and reduce the bloating. If you had a lower endoscopy (such as a colonoscopy or flexible sigmoidoscopy) you may notice spotting of blood in your stool or on the toilet paper. If you underwent a bowel prep for your procedure, you may not have a normal bowel movement for a few days.  Please Note:  You might notice some irritation and congestion in your nose or some drainage.  This is from the oxygen used during your procedure.  There is no need for concern and it should clear up in a day or so.  SYMPTOMS TO REPORT IMMEDIATELY:   Following lower endoscopy (colonoscopy or flexible sigmoidoscopy):  Excessive amounts of blood in the stool  Significant tenderness or worsening of abdominal pains  Swelling of the abdomen that is new, acute  Fever of 100F or higher  For urgent or emergent issues, a gastroenterologist can be reached at any hour by calling [phone]. Do not use MyChart messaging for urgent concerns.    DIET:  We do recommend a small meal at first, but then you may proceed to your regular diet.  Drink  plenty of fluids but you should avoid alcoholic beverages for 24 hours.  ACTIVITY:  You should plan to take it easy for the rest of today and you should NOT DRIVE or use heavy machinery until tomorrow (because of the sedation medicines used during the test).    FOLLOW UP: Our staff will call the number listed on your records 48-72 hours following your procedure to check on you and address any questions or concerns that you may have regarding the information given to you following your procedure. If we do not reach you, we will leave a message.  We will attempt to reach you two times.  During this call, we will ask if you have developed any symptoms of COVID 19. If you develop any symptoms (ie: fever, flu-like symptoms, shortness of breath, cough etc.) before then, please call [phone].  If you test positive for Covid 19 in the 2 weeks post procedure, please call and report this information to [place].    If any biopsies were taken you will be contacted by phone or by letter within the next 1-3 weeks.  Please call us at [phone] if you have not heard about the biopsies in 3 weeks.    SIGNATURES/CONFIDENTIALITY: You and/or your care partner have signed paperwork which will be entered into your electronic medical record.  These signatures attest to the fact that that the information above on your After Visit Summary has been reviewed and is understood.  Full responsibility of the confidentiality of this discharge information lies with you and/or your care-partner.

## 2019-09-07 NOTE — Progress Notes (Signed)
Vs-DT  Temp- LC

## 2019-09-09 ENCOUNTER — Telehealth: Payer: Self-pay | Admitting: *Deleted

## 2019-09-09 NOTE — Telephone Encounter (Signed)
  Follow up Call-  Call back number 09/07/2019  Post procedure Call Back phone  # [phone]  Permission to leave phone message Yes  Some recent data might be hidden     Patient questions:  Do you have a fever, pain , or abdominal swelling? No. Pain Score  0 *  Have you tolerated food without any problems? Yes.    Have you been able to return to your normal activities? Yes.    Do you have any questions about your discharge instructions: Diet   No. Medications  No. Follow up visit  No.  Do you have questions or concerns about your Care? No.  Actions: * If pain score is 4 or above: No action needed, pain <4.  1. Have you developed a fever since your procedure? no  2.   Have you had an respiratory symptoms (SOB or cough) since your procedure? no  3.   Have you tested positive for COVID 19 since your procedure no  4.   Have you had any family members/close contacts diagnosed with the COVID 19 since your procedure?  no   If yes to any of these questions please route to Joylene John, RN and Erenest Rasher, RN

## 2019-09-12 DIAGNOSIS — R972 Elevated prostate specific antigen [PSA]: Secondary | ICD-10-CM | POA: Diagnosis not present

## 2019-09-22 ENCOUNTER — Encounter: Payer: Self-pay | Admitting: Gastroenterology

## 2019-11-17 DIAGNOSIS — E039 Hypothyroidism, unspecified: Secondary | ICD-10-CM | POA: Diagnosis not present

## 2019-11-17 DIAGNOSIS — N183 Chronic kidney disease, stage 3 unspecified: Secondary | ICD-10-CM | POA: Diagnosis not present

## 2019-11-17 DIAGNOSIS — Z125 Encounter for screening for malignant neoplasm of prostate: Secondary | ICD-10-CM | POA: Diagnosis not present

## 2019-11-17 DIAGNOSIS — Z13220 Encounter for screening for lipoid disorders: Secondary | ICD-10-CM | POA: Diagnosis not present

## 2019-11-17 DIAGNOSIS — I10 Essential (primary) hypertension: Secondary | ICD-10-CM | POA: Diagnosis not present

## 2019-11-23 DIAGNOSIS — Z125 Encounter for screening for malignant neoplasm of prostate: Secondary | ICD-10-CM | POA: Diagnosis not present

## 2019-11-24 DIAGNOSIS — Z0000 Encounter for general adult medical examination without abnormal findings: Secondary | ICD-10-CM | POA: Diagnosis not present

## 2019-11-24 DIAGNOSIS — I120 Hypertensive chronic kidney disease with stage 5 chronic kidney disease or end stage renal disease: Secondary | ICD-10-CM | POA: Diagnosis not present

## 2019-11-24 DIAGNOSIS — Z7901 Long term (current) use of anticoagulants: Secondary | ICD-10-CM | POA: Diagnosis not present

## 2019-11-24 DIAGNOSIS — R972 Elevated prostate specific antigen [PSA]: Secondary | ICD-10-CM | POA: Diagnosis not present

## 2019-11-24 DIAGNOSIS — Z23 Encounter for immunization: Secondary | ICD-10-CM | POA: Diagnosis not present

## 2019-11-24 DIAGNOSIS — R7309 Other abnormal glucose: Secondary | ICD-10-CM | POA: Diagnosis not present

## 2019-11-24 DIAGNOSIS — Z86711 Personal history of pulmonary embolism: Secondary | ICD-10-CM | POA: Diagnosis not present

## 2019-11-24 DIAGNOSIS — E039 Hypothyroidism, unspecified: Secondary | ICD-10-CM | POA: Diagnosis not present

## 2019-11-24 DIAGNOSIS — N400 Enlarged prostate without lower urinary tract symptoms: Secondary | ICD-10-CM | POA: Diagnosis not present

## 2020-02-20 ENCOUNTER — Ambulatory Visit: Payer: Self-pay | Admitting: Podiatrist

## 2020-02-28 DIAGNOSIS — R972 Elevated prostate specific antigen [PSA]: Secondary | ICD-10-CM | POA: Diagnosis not present

## 2020-03-05 ENCOUNTER — Ambulatory Visit: Payer: Medicare HMO | Admitting: Podiatry

## 2020-03-05 ENCOUNTER — Other Ambulatory Visit: Payer: Self-pay

## 2020-03-05 ENCOUNTER — Encounter: Payer: Self-pay | Admitting: Podiatry

## 2020-03-05 DIAGNOSIS — M79674 Pain in right toe(s): Secondary | ICD-10-CM | POA: Diagnosis not present

## 2020-03-05 DIAGNOSIS — B353 Tinea pedis: Secondary | ICD-10-CM

## 2020-03-05 DIAGNOSIS — B351 Tinea unguium: Secondary | ICD-10-CM

## 2020-03-05 DIAGNOSIS — M79675 Pain in left toe(s): Secondary | ICD-10-CM

## 2020-03-05 MED ORDER — CICLOPIROX 8 % EX SOLN: Freq: Every day | CUTANEOUS | 2 refills | Status: DC

## 2020-03-05 MED ORDER — KETOCONAZOLE 2 % EX CREA: 1.0000 "application " | TOPICAL_CREAM | Freq: Every day | CUTANEOUS | 2 refills | Status: DC

## 2020-03-05 NOTE — Progress Notes (Signed)
  Subjective:  Patient ID: Hunter Sandoval, male    DOB: [birth year],  MRN: [id number]  Chief Complaint  Patient presents with  . Nail Problem    Patient presents today for nail fungus bilat all toes x years and athletes foot bilat.  He says his feet burn and itch on bottoms of feet and between toes  . Tinea Pedis    69 y.o. male presents with the above complaint. History confirmed with patient.  Referred by his PCP.  Objective:  Physical Exam: warm, good capillary refill, bunions, no trophic changes or ulcerative lesions, normal DP and PT pulses, normal sensory exam and tinea pedis. Assessment:   1. Tinea pedis of both feet   2. Onychomycosis   3. Pain due to onychomycosis of toenails of both feet      Plan:  Patient was evaluated and treated and all questions answered.  Discussed the etiology and treatment options for the condition in detail with the patient. Educated patient on the topical and oral treatment options for mycotic nails. Recommended debridement of the nails today. Sharp and mechanical debridement performed of all painful and mycotic nails today. Nails debrided in length and thickness using a nail nipper and a mechanical burr to level of comfort. Discussed treatment options including appropriate shoe gear. Follow up as needed for painful nails.  Rx for Penlac sent to pharmacy  Discussed the etiology and treatment options for tinea pedis.  Discussed topical and oral treatment.  Recommended topical treatment with 2% ketoconazole cream.  This was sent to the patient's pharmacy.       Return in about 3 months (around 06/05/2020).

## 2020-03-06 DIAGNOSIS — R972 Elevated prostate specific antigen [PSA]: Secondary | ICD-10-CM | POA: Diagnosis not present

## 2020-03-06 DIAGNOSIS — N400 Enlarged prostate without lower urinary tract symptoms: Secondary | ICD-10-CM | POA: Diagnosis not present

## 2020-03-09 ENCOUNTER — Other Ambulatory Visit: Payer: Self-pay | Admitting: Urology

## 2020-03-09 DIAGNOSIS — R972 Elevated prostate specific antigen [PSA]: Secondary | ICD-10-CM

## 2020-04-16 ENCOUNTER — Other Ambulatory Visit: Payer: Self-pay

## 2020-04-16 ENCOUNTER — Ambulatory Visit
Admission: RE | Admit: 2020-04-16 | Discharge: 2020-04-16 | Disposition: A | Discharge status: Home / Self Care | Payer: Medicare HMO | Source: Ambulatory Visit | Attending: Urology | Admitting: Urology

## 2020-04-16 DIAGNOSIS — R972 Elevated prostate specific antigen [PSA]: Secondary | ICD-10-CM

## 2020-04-16 MED ORDER — GADOBENATE DIMEGLUMINE 529 MG/ML IV SOLN
19.0000 mL | Freq: Once | INTRAVENOUS | Status: AC | PRN
  Administered 2020-04-16: 19 mL via INTRAVENOUS

## 2020-04-17 DIAGNOSIS — Z85828 Personal history of other malignant neoplasm of skin: Secondary | ICD-10-CM | POA: Diagnosis not present

## 2020-04-17 DIAGNOSIS — L814 Other melanin hyperpigmentation: Secondary | ICD-10-CM | POA: Diagnosis not present

## 2020-04-17 DIAGNOSIS — L905 Scar conditions and fibrosis of skin: Secondary | ICD-10-CM | POA: Diagnosis not present

## 2020-04-17 DIAGNOSIS — L218 Other seborrheic dermatitis: Secondary | ICD-10-CM | POA: Diagnosis not present

## 2020-04-17 DIAGNOSIS — L821 Other seborrheic keratosis: Secondary | ICD-10-CM | POA: Diagnosis not present

## 2020-04-17 DIAGNOSIS — L720 Epidermal cyst: Secondary | ICD-10-CM | POA: Diagnosis not present

## 2020-04-17 DIAGNOSIS — D229 Melanocytic nevi, unspecified: Secondary | ICD-10-CM | POA: Diagnosis not present

## 2020-04-19 DIAGNOSIS — R69 Illness, unspecified: Secondary | ICD-10-CM | POA: Diagnosis not present

## 2020-05-24 DIAGNOSIS — R972 Elevated prostate specific antigen [PSA]: Secondary | ICD-10-CM | POA: Diagnosis not present

## 2020-05-24 DIAGNOSIS — I129 Hypertensive chronic kidney disease with stage 1 through stage 4 chronic kidney disease, or unspecified chronic kidney disease: Secondary | ICD-10-CM | POA: Diagnosis not present

## 2020-05-24 DIAGNOSIS — R7301 Impaired fasting glucose: Secondary | ICD-10-CM | POA: Diagnosis not present

## 2020-05-24 DIAGNOSIS — I10 Essential (primary) hypertension: Secondary | ICD-10-CM | POA: Diagnosis not present

## 2020-05-24 DIAGNOSIS — E039 Hypothyroidism, unspecified: Secondary | ICD-10-CM | POA: Diagnosis not present

## 2020-06-07 ENCOUNTER — Ambulatory Visit (INDEPENDENT_AMBULATORY_CARE_PROVIDER_SITE_OTHER): Payer: Medicare HMO | Admitting: Podiatry

## 2020-06-07 ENCOUNTER — Encounter: Payer: Self-pay | Admitting: Podiatry

## 2020-06-07 ENCOUNTER — Other Ambulatory Visit: Payer: Self-pay

## 2020-06-07 DIAGNOSIS — M79674 Pain in right toe(s): Secondary | ICD-10-CM | POA: Diagnosis not present

## 2020-06-07 DIAGNOSIS — B351 Tinea unguium: Secondary | ICD-10-CM | POA: Diagnosis not present

## 2020-06-07 DIAGNOSIS — M79675 Pain in left toe(s): Secondary | ICD-10-CM | POA: Diagnosis not present

## 2020-06-07 MED ORDER — CICLOPIROX 8 % EX SOLN: Freq: Every day | CUTANEOUS | 4 refills | Status: DC

## 2020-06-07 NOTE — Progress Notes (Signed)
  Subjective:  Patient ID: Hunter Sandoval, male    DOB: 03/21/1951,  MRN: [id number]  Chief Complaint  Patient presents with  . routine foot care    Nail trim     70 y.o. male presents with the above complaint. History confirmed with patient.  Referred by his PCP.  Objective:  Physical Exam: warm, good capillary refill, bunions, no trophic changes or ulcerative lesions, normal DP and PT pulses, normal sensory exam and tinea pedis with improvement, some interdigital scaling but no burning. Onychomycosis x10 with yellow discoloration and thickening of the nail plates. Assessment:   1. Onychomycosis   2. Pain due to onychomycosis of toenails of both feet      Plan:  Patient was evaluated and treated and all questions answered.  Discussed the etiology and treatment options for the condition in detail with the patient. Educated patient on the topical and oral treatment options for mycotic nails. Recommended debridement of the nails today. Sharp and mechanical debridement performed of all painful and mycotic nails today. Nails debrided in length and thickness using a nail nipper and a mechanical burr to level of comfort. Discussed treatment options including appropriate shoe gear. Follow up as needed for painful nails.  Refill for Penlac sent to pharmacy  Continue ketoconazole PRN for tinea pedis, much improved     Return in about 3 months (around 09/04/2020) for fungal nail care.

## 2020-09-03 DIAGNOSIS — R972 Elevated prostate specific antigen [PSA]: Secondary | ICD-10-CM | POA: Diagnosis not present

## 2020-09-06 ENCOUNTER — Ambulatory Visit: Payer: Medicare HMO | Admitting: Podiatry

## 2020-09-27 ENCOUNTER — Other Ambulatory Visit: Payer: Self-pay

## 2020-09-27 ENCOUNTER — Ambulatory Visit: Payer: Medicare HMO | Admitting: Podiatry

## 2020-09-27 ENCOUNTER — Encounter: Payer: Self-pay | Admitting: Podiatry

## 2020-09-27 DIAGNOSIS — M79674 Pain in right toe(s): Secondary | ICD-10-CM

## 2020-09-27 DIAGNOSIS — B351 Tinea unguium: Secondary | ICD-10-CM | POA: Diagnosis not present

## 2020-09-27 DIAGNOSIS — M79675 Pain in left toe(s): Secondary | ICD-10-CM | POA: Diagnosis not present

## 2020-10-01 NOTE — Progress Notes (Signed)
  Subjective:  Patient ID: Hunter Sandoval, male    DOB: 11/18/1950,  MRN: [id number]  Chief Complaint  Patient presents with  . Nail Problem    Thick painful toenails, 3 month follow up    70 y.o. male returns for follow-up with the above complaint. History confirmed with patient.  Referred by his PCP.  No new issues the nails are painful again  Objective:  Physical Exam: warm, good capillary refill, bunions, no trophic changes or ulcerative lesions, normal DP and PT pulses, normal sensory exam and tinea pedis with improvement, some interdigital scaling but no burning. Onychomycosis x10 with yellow discoloration and thickening of the nail plates. Assessment:   1. Pain due to onychomycosis of toenails of both feet      Plan:  Patient was evaluated and treated and all questions answered.  Discussed the etiology and treatment options for the condition in detail with the patient. Educated patient on the topical and oral treatment options for mycotic nails. Recommended debridement of the nails today. Sharp and mechanical debridement performed of all painful and mycotic nails today. Nails debrided in length and thickness using a nail nipper and a mechanical burr to level of comfort. Discussed treatment options including appropriate shoe gear. Follow up as needed for painful nails.  Refill for Penlac sent to pharmacy  Continue ketoconazole PRN for tinea pedis, much improved     Return in about 3 months (around 12/28/2020) for painful nails.

## 2020-10-15 DIAGNOSIS — H524 Presbyopia: Secondary | ICD-10-CM | POA: Diagnosis not present

## 2020-11-14 DIAGNOSIS — R7309 Other abnormal glucose: Secondary | ICD-10-CM | POA: Diagnosis not present

## 2020-11-14 DIAGNOSIS — E039 Hypothyroidism, unspecified: Secondary | ICD-10-CM | POA: Diagnosis not present

## 2020-11-14 DIAGNOSIS — Z0000 Encounter for general adult medical examination without abnormal findings: Secondary | ICD-10-CM | POA: Diagnosis not present

## 2020-11-14 DIAGNOSIS — R972 Elevated prostate specific antigen [PSA]: Secondary | ICD-10-CM | POA: Diagnosis not present

## 2020-11-26 DIAGNOSIS — N400 Enlarged prostate without lower urinary tract symptoms: Secondary | ICD-10-CM | POA: Diagnosis not present

## 2020-11-26 DIAGNOSIS — Z0000 Encounter for general adult medical examination without abnormal findings: Secondary | ICD-10-CM | POA: Diagnosis not present

## 2020-11-26 DIAGNOSIS — Z7901 Long term (current) use of anticoagulants: Secondary | ICD-10-CM | POA: Diagnosis not present

## 2020-11-26 DIAGNOSIS — N1831 Chronic kidney disease, stage 3a: Secondary | ICD-10-CM | POA: Diagnosis not present

## 2020-11-26 DIAGNOSIS — R972 Elevated prostate specific antigen [PSA]: Secondary | ICD-10-CM | POA: Diagnosis not present

## 2020-11-26 DIAGNOSIS — E785 Hyperlipidemia, unspecified: Secondary | ICD-10-CM | POA: Diagnosis not present

## 2020-11-26 DIAGNOSIS — I129 Hypertensive chronic kidney disease with stage 1 through stage 4 chronic kidney disease, or unspecified chronic kidney disease: Secondary | ICD-10-CM | POA: Diagnosis not present

## 2020-11-26 DIAGNOSIS — E559 Vitamin D deficiency, unspecified: Secondary | ICD-10-CM | POA: Diagnosis not present

## 2020-11-26 DIAGNOSIS — E039 Hypothyroidism, unspecified: Secondary | ICD-10-CM | POA: Diagnosis not present

## 2020-11-26 DIAGNOSIS — Z86711 Personal history of pulmonary embolism: Secondary | ICD-10-CM | POA: Diagnosis not present

## 2021-01-03 ENCOUNTER — Encounter: Payer: Self-pay | Admitting: Podiatry

## 2021-01-03 ENCOUNTER — Ambulatory Visit: Payer: Medicare HMO | Admitting: Podiatry

## 2021-01-03 ENCOUNTER — Other Ambulatory Visit: Payer: Self-pay

## 2021-01-03 DIAGNOSIS — M79675 Pain in left toe(s): Secondary | ICD-10-CM | POA: Diagnosis not present

## 2021-01-03 DIAGNOSIS — B351 Tinea unguium: Secondary | ICD-10-CM | POA: Diagnosis not present

## 2021-01-03 DIAGNOSIS — M79674 Pain in right toe(s): Secondary | ICD-10-CM | POA: Diagnosis not present

## 2021-01-04 NOTE — Progress Notes (Signed)
  Subjective:  Patient ID: Hunter Sandoval, male    DOB: [birth year],  MRN: [id number]  Chief Complaint  Patient presents with   Nail Problem    Thick painful toenails, 3 month follow up    70 y.o. male returns for follow-up with the above complaint. History confirmed with patient.  Referred by his PCP.  No new issues the nails are painful again  Objective:  Physical Exam: warm, good capillary refill, bunions, no trophic changes or ulcerative lesions, normal DP and PT pulses, normal sensory exam and tinea pedis with improvement, some interdigital scaling but no burning. Onychomycosis x10 with yellow discoloration and thickening of the nail plates. Assessment:   1. Pain due to onychomycosis of toenails of both feet       Plan:  Patient was evaluated and treated and all questions answered.  Discussed the etiology and treatment options for the condition in detail with the patient. Educated patient on the topical and oral treatment options for mycotic nails. Recommended debridement of the nails today. Sharp and mechanical debridement performed of all painful and mycotic nails today. Nails debrided in length and thickness using a nail nipper and a mechanical burr to level of comfort. Discussed treatment options including appropriate shoe gear. Follow up as needed for painful nails.  Refill for Penlac sent to pharmacy  Continue ketoconazole PRN for tinea pedis, much improved     Return in about 3 months (around 04/04/2021) for at risk foot care.

## 2021-03-05 DIAGNOSIS — R972 Elevated prostate specific antigen [PSA]: Secondary | ICD-10-CM | POA: Diagnosis not present

## 2021-03-12 DIAGNOSIS — N400 Enlarged prostate without lower urinary tract symptoms: Secondary | ICD-10-CM | POA: Diagnosis not present

## 2021-03-12 DIAGNOSIS — R972 Elevated prostate specific antigen [PSA]: Secondary | ICD-10-CM | POA: Diagnosis not present

## 2021-04-04 ENCOUNTER — Other Ambulatory Visit: Payer: Self-pay

## 2021-04-04 ENCOUNTER — Ambulatory Visit: Payer: Medicare HMO | Admitting: Podiatry

## 2021-04-04 DIAGNOSIS — M79675 Pain in left toe(s): Secondary | ICD-10-CM | POA: Diagnosis not present

## 2021-04-04 DIAGNOSIS — M79674 Pain in right toe(s): Secondary | ICD-10-CM | POA: Diagnosis not present

## 2021-04-04 DIAGNOSIS — B351 Tinea unguium: Secondary | ICD-10-CM | POA: Diagnosis not present

## 2021-04-04 NOTE — Progress Notes (Signed)
  Subjective:  Patient ID: Hunter Sandoval, male    DOB: 1951-03-18,  MRN: [id number]  Chief Complaint  Patient presents with   Nail Problem    Thick painful toenails, 3 month follow up    70 y.o. male returns for follow-up with the above complaint. History confirmed with patient.  Referred by his PCP.  No new issues the nails are painful again.  Still on warfarin.  Still using the Penlac  Objective:  Physical Exam: warm, good capillary refill, bunions, no trophic changes or ulcerative lesions, normal DP and PT pulses, normal sensory exam and tinea pedis with improvement, some interdigital scaling but no burning. Onychomycosis x10 with yellow discoloration and thickening of the nail plates. Assessment:   1. Pain due to onychomycosis of toenails of both feet       Plan:  Patient was evaluated and treated and all questions answered.  Discussed the etiology and treatment options for the condition in detail with the patient. Educated patient on the topical and oral treatment options for mycotic nails. Recommended debridement of the nails today. Sharp and mechanical debridement performed of all painful and mycotic nails today. Nails debrided in length and thickness using a nail nipper and a mechanical burr to level of comfort. Discussed treatment options including appropriate shoe gear. Follow up as needed for painful nails.    He is still using the Penlac.  Discussed with him that I am not sure that it is going to work at this point.  He will finish what he has and once he is done with it be off of it for a few months to see if it gets worse.     Return in about 3 months (around 07/03/2021).

## 2021-04-17 DIAGNOSIS — Z85828 Personal history of other malignant neoplasm of skin: Secondary | ICD-10-CM | POA: Diagnosis not present

## 2021-04-17 DIAGNOSIS — D225 Melanocytic nevi of trunk: Secondary | ICD-10-CM | POA: Diagnosis not present

## 2021-04-17 DIAGNOSIS — L218 Other seborrheic dermatitis: Secondary | ICD-10-CM | POA: Diagnosis not present

## 2021-04-17 DIAGNOSIS — L814 Other melanin hyperpigmentation: Secondary | ICD-10-CM | POA: Diagnosis not present

## 2021-04-17 DIAGNOSIS — Z08 Encounter for follow-up examination after completed treatment for malignant neoplasm: Secondary | ICD-10-CM | POA: Diagnosis not present

## 2021-04-17 DIAGNOSIS — L821 Other seborrheic keratosis: Secondary | ICD-10-CM | POA: Diagnosis not present

## 2021-05-29 DIAGNOSIS — E559 Vitamin D deficiency, unspecified: Secondary | ICD-10-CM | POA: Diagnosis not present

## 2021-05-29 DIAGNOSIS — E785 Hyperlipidemia, unspecified: Secondary | ICD-10-CM | POA: Diagnosis not present

## 2021-05-29 DIAGNOSIS — Z7901 Long term (current) use of anticoagulants: Secondary | ICD-10-CM | POA: Diagnosis not present

## 2021-05-29 DIAGNOSIS — E039 Hypothyroidism, unspecified: Secondary | ICD-10-CM | POA: Diagnosis not present

## 2021-05-29 DIAGNOSIS — I129 Hypertensive chronic kidney disease with stage 1 through stage 4 chronic kidney disease, or unspecified chronic kidney disease: Secondary | ICD-10-CM | POA: Diagnosis not present

## 2021-05-29 DIAGNOSIS — N1831 Chronic kidney disease, stage 3a: Secondary | ICD-10-CM | POA: Diagnosis not present

## 2021-06-05 DIAGNOSIS — Z7901 Long term (current) use of anticoagulants: Secondary | ICD-10-CM | POA: Diagnosis not present

## 2021-06-05 DIAGNOSIS — N1831 Chronic kidney disease, stage 3a: Secondary | ICD-10-CM | POA: Diagnosis not present

## 2021-06-05 DIAGNOSIS — I129 Hypertensive chronic kidney disease with stage 1 through stage 4 chronic kidney disease, or unspecified chronic kidney disease: Secondary | ICD-10-CM | POA: Diagnosis not present

## 2021-06-06 DIAGNOSIS — Z23 Encounter for immunization: Secondary | ICD-10-CM | POA: Diagnosis not present

## 2021-06-06 DIAGNOSIS — I129 Hypertensive chronic kidney disease with stage 1 through stage 4 chronic kidney disease, or unspecified chronic kidney disease: Secondary | ICD-10-CM | POA: Diagnosis not present

## 2021-06-06 DIAGNOSIS — Z86711 Personal history of pulmonary embolism: Secondary | ICD-10-CM | POA: Diagnosis not present

## 2021-06-06 DIAGNOSIS — N400 Enlarged prostate without lower urinary tract symptoms: Secondary | ICD-10-CM | POA: Diagnosis not present

## 2021-06-06 DIAGNOSIS — Z7901 Long term (current) use of anticoagulants: Secondary | ICD-10-CM | POA: Diagnosis not present

## 2021-06-06 DIAGNOSIS — R972 Elevated prostate specific antigen [PSA]: Secondary | ICD-10-CM | POA: Diagnosis not present

## 2021-06-06 DIAGNOSIS — N1831 Chronic kidney disease, stage 3a: Secondary | ICD-10-CM | POA: Diagnosis not present

## 2021-06-06 DIAGNOSIS — E039 Hypothyroidism, unspecified: Secondary | ICD-10-CM | POA: Diagnosis not present

## 2021-06-06 DIAGNOSIS — E785 Hyperlipidemia, unspecified: Secondary | ICD-10-CM | POA: Diagnosis not present

## 2021-07-04 ENCOUNTER — Ambulatory Visit: Payer: Medicare HMO | Admitting: Podiatry

## 2021-07-04 ENCOUNTER — Other Ambulatory Visit: Payer: Self-pay

## 2021-07-04 DIAGNOSIS — Z7901 Long term (current) use of anticoagulants: Secondary | ICD-10-CM

## 2021-07-04 DIAGNOSIS — B351 Tinea unguium: Secondary | ICD-10-CM | POA: Diagnosis not present

## 2021-07-04 DIAGNOSIS — M79674 Pain in right toe(s): Secondary | ICD-10-CM | POA: Diagnosis not present

## 2021-07-04 DIAGNOSIS — M79675 Pain in left toe(s): Secondary | ICD-10-CM | POA: Diagnosis not present

## 2021-07-04 NOTE — Progress Notes (Signed)
?  Subjective:  ?Patient ID: Hunter Sandoval, male    DOB: 07/02/1950,  MRN: [id number] ? ?Chief Complaint  ?Patient presents with  ? routine foot care  ?  Nail trim   ? ? ?71 y.o. male returns for follow-up with the above complaint. History confirmed with patient.  Referred by his PCP.  No new issues the nails are painful again.  Still on warfarin.   ? ?Objective:  ?Physical Exam: ?warm, good capillary refill, bunions, no trophic changes or ulcerative lesions, normal DP and PT pulses, normal sensory exam and tinea pedis has resolved at this point. Onychomycosis x10 with yellow discoloration and thickening of the nail plates. ?Assessment:  ? ?1. Pain due to onychomycosis of toenails of both feet   ?2. Chronic anticoagulation   ? ? ? ? ?Plan:  ?Patient was evaluated and treated and all questions answered. ? ?Discussed the etiology and treatment options for the condition in detail with the patient. Educated patient on the topical and oral treatment options for mycotic nails. Recommended debridement of the nails today. Sharp and mechanical debridement performed of all painful and mycotic nails today. Nails debrided in length and thickness using a nail nipper and a mechanical burr to level of comfort. Discussed treatment options including appropriate shoe gear. Follow up as needed for painful nails.   ? ?No longer using the Penlac did not seem to help much. ? ?Ketoconazole as needed for tinea pedis he will let me know if he needs refills ? ?Return in about 3 months (around 10/04/2021) for at risk foot care.  ? ?

## 2021-09-03 DIAGNOSIS — R972 Elevated prostate specific antigen [PSA]: Secondary | ICD-10-CM | POA: Diagnosis not present

## 2021-09-10 DIAGNOSIS — N400 Enlarged prostate without lower urinary tract symptoms: Secondary | ICD-10-CM | POA: Diagnosis not present

## 2021-09-10 DIAGNOSIS — R972 Elevated prostate specific antigen [PSA]: Secondary | ICD-10-CM | POA: Diagnosis not present

## 2021-09-16 DIAGNOSIS — H6121 Impacted cerumen, right ear: Secondary | ICD-10-CM | POA: Diagnosis not present

## 2021-09-16 DIAGNOSIS — H903 Sensorineural hearing loss, bilateral: Secondary | ICD-10-CM | POA: Diagnosis not present

## 2021-10-08 ENCOUNTER — Ambulatory Visit: Payer: Medicare HMO | Admitting: Podiatry

## 2021-10-08 DIAGNOSIS — M79675 Pain in left toe(s): Secondary | ICD-10-CM | POA: Diagnosis not present

## 2021-10-08 DIAGNOSIS — B351 Tinea unguium: Secondary | ICD-10-CM | POA: Diagnosis not present

## 2021-10-08 DIAGNOSIS — M79674 Pain in right toe(s): Secondary | ICD-10-CM

## 2021-10-11 NOTE — Progress Notes (Signed)
  Subjective:  Patient ID: Hunter Sandoval, male    DOB: 1950-08-02,  MRN: [id number]  Chief Complaint  Patient presents with   Nail Problem    Thick painful toenails, 3 month follow up     70 y.o. male returns for follow-up with the above complaint. History confirmed with patient.  Referred by his PCP.  No new issues the nails are painful again.  Still on warfarin.    Objective:  Physical Exam: warm, good capillary refill, bunions, no trophic changes or ulcerative lesions, normal DP and PT pulses, normal sensory exam. Onychomycosis x10 with yellow discoloration and thickening of the nail plates. Assessment:   1. Pain due to onychomycosis of toenails of both feet       Plan:  Patient was evaluated and treated and all questions answered.  Discussed the etiology and treatment options for the condition in detail with the patient. Educated patient on the topical and oral treatment options for mycotic nails. Recommended debridement of the nails today. Sharp and mechanical debridement performed of all painful and mycotic nails today. Nails debrided in length and thickness using a nail nipper and a mechanical burr to level of comfort. Discussed treatment options including appropriate shoe gear. Follow up as needed for painful nails.    We discontinued the Penlac has not improved with this we will continue intermittent routine regular debridement to maintain the nails at a comfortable and safe level  No follow-ups on file.

## 2021-11-28 DIAGNOSIS — E559 Vitamin D deficiency, unspecified: Secondary | ICD-10-CM | POA: Diagnosis not present

## 2021-11-28 DIAGNOSIS — Z7901 Long term (current) use of anticoagulants: Secondary | ICD-10-CM | POA: Diagnosis not present

## 2021-11-28 DIAGNOSIS — I129 Hypertensive chronic kidney disease with stage 1 through stage 4 chronic kidney disease, or unspecified chronic kidney disease: Secondary | ICD-10-CM | POA: Diagnosis not present

## 2021-11-28 DIAGNOSIS — N1831 Chronic kidney disease, stage 3a: Secondary | ICD-10-CM | POA: Diagnosis not present

## 2021-11-28 DIAGNOSIS — E785 Hyperlipidemia, unspecified: Secondary | ICD-10-CM | POA: Diagnosis not present

## 2021-11-28 DIAGNOSIS — E039 Hypothyroidism, unspecified: Secondary | ICD-10-CM | POA: Diagnosis not present

## 2021-12-05 DIAGNOSIS — Z86711 Personal history of pulmonary embolism: Secondary | ICD-10-CM | POA: Diagnosis not present

## 2021-12-05 DIAGNOSIS — R7309 Other abnormal glucose: Secondary | ICD-10-CM | POA: Diagnosis not present

## 2021-12-05 DIAGNOSIS — E559 Vitamin D deficiency, unspecified: Secondary | ICD-10-CM | POA: Diagnosis not present

## 2021-12-05 DIAGNOSIS — E039 Hypothyroidism, unspecified: Secondary | ICD-10-CM | POA: Diagnosis not present

## 2021-12-05 DIAGNOSIS — Z7901 Long term (current) use of anticoagulants: Secondary | ICD-10-CM | POA: Diagnosis not present

## 2021-12-05 DIAGNOSIS — I129 Hypertensive chronic kidney disease with stage 1 through stage 4 chronic kidney disease, or unspecified chronic kidney disease: Secondary | ICD-10-CM | POA: Diagnosis not present

## 2021-12-05 DIAGNOSIS — Z0000 Encounter for general adult medical examination without abnormal findings: Secondary | ICD-10-CM | POA: Diagnosis not present

## 2021-12-05 DIAGNOSIS — N1831 Chronic kidney disease, stage 3a: Secondary | ICD-10-CM | POA: Diagnosis not present

## 2022-01-07 ENCOUNTER — Ambulatory Visit: Payer: Medicare HMO | Admitting: Podiatry

## 2022-01-07 DIAGNOSIS — B351 Tinea unguium: Secondary | ICD-10-CM | POA: Diagnosis not present

## 2022-01-07 DIAGNOSIS — M79674 Pain in right toe(s): Secondary | ICD-10-CM | POA: Diagnosis not present

## 2022-01-07 DIAGNOSIS — M79675 Pain in left toe(s): Secondary | ICD-10-CM

## 2022-01-09 NOTE — Progress Notes (Signed)
  Subjective:  Patient ID: Hunter Sandoval, male    DOB: [birth year],  MRN: [id number]  Chief Complaint  Patient presents with   Nail Problem    Routine Foot Care     71 y.o. male returns for follow-up with the above complaint. History confirmed with patient.  Referred by his PCP.  No new issues the nails are painful again.  Still on warfarin.    Objective:  Physical Exam: warm, good capillary refill, bunions, no trophic changes or ulcerative lesions, normal DP and PT pulses, normal sensory exam. Onychomycosis x10 with yellow discoloration and thickening of the nail plates. Assessment:   1. Pain due to onychomycosis of toenails of both feet        Plan:  Patient was evaluated and treated and all questions answered.  Discussed the etiology and treatment options for the condition in detail with the patient. Educated patient on the topical and oral treatment options for mycotic nails. Recommended debridement of the nails today. Sharp and mechanical debridement performed of all painful and mycotic nails today. Nails debrided in length and thickness using a nail nipper and a mechanical burr to level of comfort. Discussed treatment options including appropriate shoe gear. Follow up as needed for painful nails.       Return in about 3 months (around 04/08/2022) for at risk foot care.

## 2022-03-06 DIAGNOSIS — R972 Elevated prostate specific antigen [PSA]: Secondary | ICD-10-CM | POA: Diagnosis not present

## 2022-03-13 DIAGNOSIS — R972 Elevated prostate specific antigen [PSA]: Secondary | ICD-10-CM | POA: Diagnosis not present

## 2022-03-13 DIAGNOSIS — N400 Enlarged prostate without lower urinary tract symptoms: Secondary | ICD-10-CM | POA: Diagnosis not present

## 2022-04-10 ENCOUNTER — Ambulatory Visit: Payer: Medicare HMO | Admitting: Podiatry

## 2022-04-10 DIAGNOSIS — M79675 Pain in left toe(s): Secondary | ICD-10-CM

## 2022-04-10 DIAGNOSIS — B351 Tinea unguium: Secondary | ICD-10-CM | POA: Diagnosis not present

## 2022-04-10 DIAGNOSIS — M79674 Pain in right toe(s): Secondary | ICD-10-CM

## 2022-04-14 NOTE — Progress Notes (Signed)
  Subjective:  Patient ID: Hunter Sandoval, male    DOB: Sep 02, 1950,  MRN: [id number]  Chief Complaint  Patient presents with   Nail Problem    Thick painful toenails, 3 month follow up    71 y.o. male returns for follow-up with the above complaint. History confirmed with patient.  Referred by his PCP.  No new issues the nails are painful again.  Still on warfarin.    Objective:  Physical Exam: warm, good capillary refill, bunions, no trophic changes or ulcerative lesions, normal DP and PT pulses, normal sensory exam. Onychomycosis x10 with yellow discoloration and thickening of the nail plates. Assessment:   1. Pain due to onychomycosis of toenails of both feet        Plan:  Patient was evaluated and treated and all questions answered.  Discussed the etiology and treatment options for the condition in detail with the patient. Educated patient on the topical and oral treatment options for mycotic nails. Recommended debridement of the nails today. Sharp and mechanical debridement performed of all painful and mycotic nails today. Nails debrided in length and thickness using a nail nipper and a mechanical burr to level of comfort. Discussed treatment options including appropriate shoe gear. Follow up as needed for painful nails.       Return in about 3 months (around 07/10/2022) for painful thick nails.

## 2022-04-17 DIAGNOSIS — L821 Other seborrheic keratosis: Secondary | ICD-10-CM | POA: Diagnosis not present

## 2022-04-17 DIAGNOSIS — D492 Neoplasm of unspecified behavior of bone, soft tissue, and skin: Secondary | ICD-10-CM | POA: Diagnosis not present

## 2022-04-17 DIAGNOSIS — C4441 Basal cell carcinoma of skin of scalp and neck: Secondary | ICD-10-CM | POA: Diagnosis not present

## 2022-04-17 DIAGNOSIS — L538 Other specified erythematous conditions: Secondary | ICD-10-CM | POA: Diagnosis not present

## 2022-04-17 DIAGNOSIS — L570 Actinic keratosis: Secondary | ICD-10-CM | POA: Diagnosis not present

## 2022-04-17 DIAGNOSIS — L814 Other melanin hyperpigmentation: Secondary | ICD-10-CM | POA: Diagnosis not present

## 2022-04-17 DIAGNOSIS — D225 Melanocytic nevi of trunk: Secondary | ICD-10-CM | POA: Diagnosis not present

## 2022-05-30 DIAGNOSIS — Z125 Encounter for screening for malignant neoplasm of prostate: Secondary | ICD-10-CM | POA: Diagnosis not present

## 2022-05-30 DIAGNOSIS — R946 Abnormal results of thyroid function studies: Secondary | ICD-10-CM | POA: Diagnosis not present

## 2022-05-30 DIAGNOSIS — R7301 Impaired fasting glucose: Secondary | ICD-10-CM | POA: Diagnosis not present

## 2022-05-30 DIAGNOSIS — Z13220 Encounter for screening for lipoid disorders: Secondary | ICD-10-CM | POA: Diagnosis not present

## 2022-05-30 DIAGNOSIS — E559 Vitamin D deficiency, unspecified: Secondary | ICD-10-CM | POA: Diagnosis not present

## 2022-05-30 DIAGNOSIS — Z79899 Other long term (current) drug therapy: Secondary | ICD-10-CM | POA: Diagnosis not present

## 2022-05-30 DIAGNOSIS — R7309 Other abnormal glucose: Secondary | ICD-10-CM | POA: Diagnosis not present

## 2022-06-02 DIAGNOSIS — Z85828 Personal history of other malignant neoplasm of skin: Secondary | ICD-10-CM | POA: Diagnosis not present

## 2022-06-02 DIAGNOSIS — L905 Scar conditions and fibrosis of skin: Secondary | ICD-10-CM | POA: Diagnosis not present

## 2022-06-02 DIAGNOSIS — Z08 Encounter for follow-up examination after completed treatment for malignant neoplasm: Secondary | ICD-10-CM | POA: Diagnosis not present

## 2022-06-06 DIAGNOSIS — Z86711 Personal history of pulmonary embolism: Secondary | ICD-10-CM | POA: Diagnosis not present

## 2022-06-06 DIAGNOSIS — E039 Hypothyroidism, unspecified: Secondary | ICD-10-CM | POA: Diagnosis not present

## 2022-06-06 DIAGNOSIS — N1831 Chronic kidney disease, stage 3a: Secondary | ICD-10-CM | POA: Diagnosis not present

## 2022-06-06 DIAGNOSIS — Z7901 Long term (current) use of anticoagulants: Secondary | ICD-10-CM | POA: Diagnosis not present

## 2022-06-06 DIAGNOSIS — N400 Enlarged prostate without lower urinary tract symptoms: Secondary | ICD-10-CM | POA: Diagnosis not present

## 2022-06-06 DIAGNOSIS — I129 Hypertensive chronic kidney disease with stage 1 through stage 4 chronic kidney disease, or unspecified chronic kidney disease: Secondary | ICD-10-CM | POA: Diagnosis not present

## 2022-06-06 DIAGNOSIS — E785 Hyperlipidemia, unspecified: Secondary | ICD-10-CM | POA: Diagnosis not present

## 2022-07-10 ENCOUNTER — Ambulatory Visit: Payer: Medicare HMO | Admitting: Podiatry

## 2022-07-10 DIAGNOSIS — M79674 Pain in right toe(s): Secondary | ICD-10-CM

## 2022-07-10 DIAGNOSIS — M79675 Pain in left toe(s): Secondary | ICD-10-CM | POA: Diagnosis not present

## 2022-07-10 DIAGNOSIS — B351 Tinea unguium: Secondary | ICD-10-CM

## 2022-07-10 NOTE — Progress Notes (Signed)
  Subjective:  Patient ID: Hunter Sandoval, male    DOB: March 02, 1951,  MRN: [id number]  Chief Complaint  Patient presents with   Nail Problem    Thick painful toenails, 3 month follow up    72 y.o. male returns for follow-up with the above complaint. History confirmed with patient.  Referred by his PCP.  No new issues the nails are painful again.  Still on warfarin.  Previous debridements have been helpful in reducing pain and improving his ambulation and function  Objective:  Physical Exam: warm, good capillary refill, bunions, no trophic changes or ulcerative lesions, normal DP and PT pulses, normal sensory exam. Onychomycosis x10 with yellow discoloration and thickening of the nail plates. Assessment:   1. Pain due to onychomycosis of toenails of both feet        Plan:  Patient was evaluated and treated and all questions answered.  Discussed the etiology and treatment options for the condition in detail with the patient.  Previous debridements helpful so far. Recommended debridement of the nails today. Sharp and mechanical debridement performed of all painful and mycotic nails today. Nails debrided in length and thickness using a nail nipper and a mechanical burr to level of comfort. Discussed treatment options including appropriate shoe gear. Follow up in 3 months for painful thickened mycotic nails     Return in about 3 months (around 10/10/2022) for painful thick fungal nails.

## 2022-09-10 DIAGNOSIS — R972 Elevated prostate specific antigen [PSA]: Secondary | ICD-10-CM | POA: Diagnosis not present

## 2022-09-17 DIAGNOSIS — N400 Enlarged prostate without lower urinary tract symptoms: Secondary | ICD-10-CM | POA: Diagnosis not present

## 2022-09-17 DIAGNOSIS — R972 Elevated prostate specific antigen [PSA]: Secondary | ICD-10-CM | POA: Diagnosis not present

## 2022-10-02 DIAGNOSIS — L905 Scar conditions and fibrosis of skin: Secondary | ICD-10-CM | POA: Diagnosis not present

## 2022-10-02 DIAGNOSIS — L821 Other seborrheic keratosis: Secondary | ICD-10-CM | POA: Diagnosis not present

## 2022-10-02 DIAGNOSIS — Z85828 Personal history of other malignant neoplasm of skin: Secondary | ICD-10-CM | POA: Diagnosis not present

## 2022-10-02 DIAGNOSIS — D225 Melanocytic nevi of trunk: Secondary | ICD-10-CM | POA: Diagnosis not present

## 2022-10-02 DIAGNOSIS — L814 Other melanin hyperpigmentation: Secondary | ICD-10-CM | POA: Diagnosis not present

## 2022-10-02 DIAGNOSIS — L578 Other skin changes due to chronic exposure to nonionizing radiation: Secondary | ICD-10-CM | POA: Diagnosis not present

## 2022-10-02 DIAGNOSIS — L570 Actinic keratosis: Secondary | ICD-10-CM | POA: Diagnosis not present

## 2022-10-02 DIAGNOSIS — Z08 Encounter for follow-up examination after completed treatment for malignant neoplasm: Secondary | ICD-10-CM | POA: Diagnosis not present

## 2022-10-09 ENCOUNTER — Ambulatory Visit: Payer: Medicare HMO | Admitting: Podiatry

## 2022-10-09 DIAGNOSIS — B351 Tinea unguium: Secondary | ICD-10-CM | POA: Diagnosis not present

## 2022-10-09 DIAGNOSIS — M79674 Pain in right toe(s): Secondary | ICD-10-CM

## 2022-10-09 DIAGNOSIS — M79675 Pain in left toe(s): Secondary | ICD-10-CM

## 2022-10-12 NOTE — Progress Notes (Signed)
  Subjective:  Patient ID: Hunter Sandoval, male    DOB: 12/26/1950,  MRN: [id number]  Chief Complaint  Patient presents with   Nail Problem    Patient came in today for routine foot care, nail trim, nails are thick and yellow     72 y.o. male returns for follow-up with the above complaint. History confirmed with patient. Previous debridements have been helpful in reducing pain and improving his ambulation and function  Objective:  Physical Exam: warm, good capillary refill, bunions, no trophic changes or ulcerative lesions, normal DP and PT pulses, normal sensory exam. Onychomycosis x10 with yellow discoloration and thickening of the nail plates. Assessment:   1. Pain due to onychomycosis of toenails of both feet        Plan:  Patient was evaluated and treated and all questions answered.  Discussed the etiology and treatment options for the condition in detail with the patient.  Previous debridements helpful so far. Recommended debridement of the nails today. Sharp and mechanical debridement performed of all painful and mycotic nails today. Nails debrided in length and thickness using a nail nipper and a mechanical burr to level of comfort. Discussed treatment options including appropriate shoe gear. Follow up in 3 months for painful thickened mycotic nails     Return in about 3 months (around 01/09/2023) for painful thick fungal nails.

## 2022-10-27 DIAGNOSIS — H5203 Hypermetropia, bilateral: Secondary | ICD-10-CM | POA: Diagnosis not present

## 2022-12-02 DIAGNOSIS — N1831 Chronic kidney disease, stage 3a: Secondary | ICD-10-CM | POA: Diagnosis not present

## 2022-12-02 DIAGNOSIS — I129 Hypertensive chronic kidney disease with stage 1 through stage 4 chronic kidney disease, or unspecified chronic kidney disease: Secondary | ICD-10-CM | POA: Diagnosis not present

## 2022-12-02 DIAGNOSIS — E039 Hypothyroidism, unspecified: Secondary | ICD-10-CM | POA: Diagnosis not present

## 2022-12-02 DIAGNOSIS — E785 Hyperlipidemia, unspecified: Secondary | ICD-10-CM | POA: Diagnosis not present

## 2022-12-09 DIAGNOSIS — I129 Hypertensive chronic kidney disease with stage 1 through stage 4 chronic kidney disease, or unspecified chronic kidney disease: Secondary | ICD-10-CM | POA: Diagnosis not present

## 2022-12-09 DIAGNOSIS — E039 Hypothyroidism, unspecified: Secondary | ICD-10-CM | POA: Diagnosis not present

## 2022-12-09 DIAGNOSIS — N1831 Chronic kidney disease, stage 3a: Secondary | ICD-10-CM | POA: Diagnosis not present

## 2022-12-09 DIAGNOSIS — N400 Enlarged prostate without lower urinary tract symptoms: Secondary | ICD-10-CM | POA: Diagnosis not present

## 2022-12-09 DIAGNOSIS — E785 Hyperlipidemia, unspecified: Secondary | ICD-10-CM | POA: Diagnosis not present

## 2022-12-09 DIAGNOSIS — Z7901 Long term (current) use of anticoagulants: Secondary | ICD-10-CM | POA: Diagnosis not present

## 2022-12-09 DIAGNOSIS — Z0000 Encounter for general adult medical examination without abnormal findings: Secondary | ICD-10-CM | POA: Diagnosis not present

## 2023-01-08 ENCOUNTER — Encounter: Payer: Self-pay | Admitting: Podiatry

## 2023-01-08 ENCOUNTER — Ambulatory Visit: Payer: Medicare HMO | Admitting: Podiatry

## 2023-01-08 DIAGNOSIS — M79675 Pain in left toe(s): Secondary | ICD-10-CM

## 2023-01-08 DIAGNOSIS — B351 Tinea unguium: Secondary | ICD-10-CM | POA: Diagnosis not present

## 2023-01-08 DIAGNOSIS — M79674 Pain in right toe(s): Secondary | ICD-10-CM | POA: Diagnosis not present

## 2023-01-08 NOTE — Progress Notes (Signed)
  Subjective:  Patient ID: Hunter Sandoval, male    DOB: 1950/07/18,  MRN: [id number]  Chief Complaint  Patient presents with   Nail Problem    "They're doing fine."    72 y.o. male returns for follow-up with the above complaint. History confirmed with patient. Previous debridements have been helpful in reducing pain and improving his ambulation and function  Objective:  Physical Exam: warm, good capillary refill, bunions, no trophic changes or ulcerative lesions, normal DP and PT pulses, normal sensory exam. Onychomycosis x10 with yellow discoloration and thickening of the nail plates. Assessment:   1. Pain due to onychomycosis of toenails of both feet        Plan:  Patient was evaluated and treated and all questions answered.  Discussed the etiology and treatment options for the condition in detail with the patient.  Previous debridements helpful so far. Recommended debridement of the nails today. Sharp and mechanical debridement performed of all painful and mycotic nails today. Nails debrided in length and thickness using a nail nipper and a mechanical burr to level of comfort. Discussed treatment options including appropriate shoe gear. Follow up in 3 months for painful thickened mycotic nails     Return in about 3 months (around 04/09/2023) for painful thick fungal nails.

## 2023-04-07 ENCOUNTER — Ambulatory Visit: Payer: Medicare HMO | Admitting: Podiatry

## 2023-04-07 DIAGNOSIS — M79674 Pain in right toe(s): Secondary | ICD-10-CM

## 2023-04-07 DIAGNOSIS — M79675 Pain in left toe(s): Secondary | ICD-10-CM

## 2023-04-07 DIAGNOSIS — B351 Tinea unguium: Secondary | ICD-10-CM

## 2023-04-07 NOTE — Progress Notes (Signed)
  Subjective:  Patient ID: Hunter Sandoval, male    DOB: 1950/11/22,  MRN: [id number]  Chief Complaint  Patient presents with   Nail Problem    RM3: Return in about 3 months for painful thick fungal nails, patient states not doing much better    72 y.o. male returns for follow-up with the above complaint. History confirmed with patient. Previous debridements have been helpful in reducing pain and improving his ambulation and function  Objective:  Physical Exam: warm, good capillary refill, bunions, no trophic changes or ulcerative lesions, normal DP and PT pulses, normal sensory exam. Onychomycosis x10 with yellow discoloration and thickening of the nail plates. Assessment:   1. Pain due to onychomycosis of toenails of both feet        Plan:  Patient was evaluated and treated and all questions answered.  Discussed the etiology and treatment options for the condition in detail with the patient.  Previous debridements helpful so far. Recommended debridement of the nails today. Sharp and mechanical debridement performed of all painful and mycotic nails today. Nails debrided in length and thickness using a nail nipper and a mechanical burr to level of comfort. Discussed treatment options including appropriate shoe gear. Follow up in 3 months for painful thickened mycotic nails     Return in about 3 months (around 07/06/2023) for follow up after nail fungus treatment.

## 2023-04-30 DIAGNOSIS — L821 Other seborrheic keratosis: Secondary | ICD-10-CM | POA: Diagnosis not present

## 2023-04-30 DIAGNOSIS — L905 Scar conditions and fibrosis of skin: Secondary | ICD-10-CM | POA: Diagnosis not present

## 2023-04-30 DIAGNOSIS — D225 Melanocytic nevi of trunk: Secondary | ICD-10-CM | POA: Diagnosis not present

## 2023-04-30 DIAGNOSIS — Z85828 Personal history of other malignant neoplasm of skin: Secondary | ICD-10-CM | POA: Diagnosis not present

## 2023-04-30 DIAGNOSIS — Z08 Encounter for follow-up examination after completed treatment for malignant neoplasm: Secondary | ICD-10-CM | POA: Diagnosis not present

## 2023-04-30 DIAGNOSIS — L814 Other melanin hyperpigmentation: Secondary | ICD-10-CM | POA: Diagnosis not present

## 2023-06-04 DIAGNOSIS — I129 Hypertensive chronic kidney disease with stage 1 through stage 4 chronic kidney disease, or unspecified chronic kidney disease: Secondary | ICD-10-CM | POA: Diagnosis not present

## 2023-06-04 DIAGNOSIS — Z86711 Personal history of pulmonary embolism: Secondary | ICD-10-CM | POA: Diagnosis not present

## 2023-06-04 DIAGNOSIS — E039 Hypothyroidism, unspecified: Secondary | ICD-10-CM | POA: Diagnosis not present

## 2023-06-04 DIAGNOSIS — N1831 Chronic kidney disease, stage 3a: Secondary | ICD-10-CM | POA: Diagnosis not present

## 2023-06-04 DIAGNOSIS — E785 Hyperlipidemia, unspecified: Secondary | ICD-10-CM | POA: Diagnosis not present

## 2023-06-11 DIAGNOSIS — E039 Hypothyroidism, unspecified: Secondary | ICD-10-CM | POA: Diagnosis not present

## 2023-06-11 DIAGNOSIS — Z86711 Personal history of pulmonary embolism: Secondary | ICD-10-CM | POA: Diagnosis not present

## 2023-06-11 DIAGNOSIS — I129 Hypertensive chronic kidney disease with stage 1 through stage 4 chronic kidney disease, or unspecified chronic kidney disease: Secondary | ICD-10-CM | POA: Diagnosis not present

## 2023-06-11 DIAGNOSIS — N1831 Chronic kidney disease, stage 3a: Secondary | ICD-10-CM | POA: Diagnosis not present

## 2023-06-11 DIAGNOSIS — Z7901 Long term (current) use of anticoagulants: Secondary | ICD-10-CM | POA: Diagnosis not present

## 2023-07-07 ENCOUNTER — Ambulatory Visit: Payer: Medicare HMO | Admitting: Podiatry

## 2023-07-07 ENCOUNTER — Encounter: Payer: Self-pay | Admitting: Podiatry

## 2023-07-07 DIAGNOSIS — B351 Tinea unguium: Secondary | ICD-10-CM

## 2023-07-07 DIAGNOSIS — M79674 Pain in right toe(s): Secondary | ICD-10-CM | POA: Diagnosis not present

## 2023-07-07 DIAGNOSIS — M79675 Pain in left toe(s): Secondary | ICD-10-CM

## 2023-07-07 NOTE — Progress Notes (Signed)
  Subjective:  Patient ID: Hunter Sandoval, male    DOB: [birth year],  MRN: [id number]  Chief Complaint  Patient presents with   Follow-up    Patient states everything has been fine since last visit, patient states he also needs his toe nails cut     73 y.o. male returns for follow-up with the above complaint. History confirmed with patient. Previous debridements have been helpful in reducing pain and improving his ambulation and function.  No other new issues  Objective:  Physical Exam: warm, good capillary refill, bunions, no trophic changes or ulcerative lesions, normal DP and PT pulses, normal sensory exam. Onychomycosis x10 with yellow discoloration and thickening of the nail plates. Assessment:   1. Pain due to onychomycosis of toenails of both feet        Plan:  Patient was evaluated and treated and all questions answered.  Discussed the etiology and treatment options for the condition in detail with the patient.  Previous debridements helpful so far. Recommended debridement of the nails today. Sharp and mechanical debridement performed of all painful and mycotic nails today. Nails debrided in length and thickness using a nail nipper and a mechanical burr to level of comfort. Discussed treatment options including appropriate shoe gear. Follow up in 3 months for painful thickened mycotic nails     Return in about 3 months (around 10/07/2023) for painful thick fungal nails.

## 2023-09-01 ENCOUNTER — Inpatient Hospital Stay (HOSPITAL_COMMUNITY)

## 2023-09-01 ENCOUNTER — Emergency Department (HOSPITAL_COMMUNITY)

## 2023-09-01 ENCOUNTER — Encounter (HOSPITAL_COMMUNITY): Payer: Self-pay

## 2023-09-01 ENCOUNTER — Other Ambulatory Visit: Payer: Self-pay

## 2023-09-01 ENCOUNTER — Inpatient Hospital Stay (HOSPITAL_COMMUNITY)
Admission: EM | Admit: 2023-09-01 | Discharge: 2023-09-04 | DRG: 378 | Disposition: A | Discharge status: Home / Self Care | Attending: Internal Medicine | Admitting: Internal Medicine

## 2023-09-01 DIAGNOSIS — D649 Anemia, unspecified: Secondary | ICD-10-CM | POA: Diagnosis not present

## 2023-09-01 DIAGNOSIS — Z86711 Personal history of pulmonary embolism: Secondary | ICD-10-CM

## 2023-09-01 DIAGNOSIS — E039 Hypothyroidism, unspecified: Secondary | ICD-10-CM | POA: Diagnosis not present

## 2023-09-01 DIAGNOSIS — Z860101 Personal history of adenomatous and serrated colon polyps: Secondary | ICD-10-CM

## 2023-09-01 DIAGNOSIS — S0181XA Laceration without foreign body of other part of head, initial encounter: Secondary | ICD-10-CM | POA: Diagnosis not present

## 2023-09-01 DIAGNOSIS — D6832 Hemorrhagic disorder due to extrinsic circulating anticoagulants: Secondary | ICD-10-CM | POA: Diagnosis not present

## 2023-09-01 DIAGNOSIS — S098XXA Other specified injuries of head, initial encounter: Secondary | ICD-10-CM | POA: Diagnosis not present

## 2023-09-01 DIAGNOSIS — Z7901 Long term (current) use of anticoagulants: Secondary | ICD-10-CM

## 2023-09-01 DIAGNOSIS — D62 Acute posthemorrhagic anemia: Principal | ICD-10-CM

## 2023-09-01 DIAGNOSIS — N183 Chronic kidney disease, stage 3 unspecified: Secondary | ICD-10-CM | POA: Diagnosis not present

## 2023-09-01 DIAGNOSIS — R197 Diarrhea, unspecified: Secondary | ICD-10-CM | POA: Diagnosis not present

## 2023-09-01 DIAGNOSIS — R791 Abnormal coagulation profile: Secondary | ICD-10-CM | POA: Diagnosis not present

## 2023-09-01 DIAGNOSIS — K254 Chronic or unspecified gastric ulcer with hemorrhage: Secondary | ICD-10-CM | POA: Diagnosis not present

## 2023-09-01 DIAGNOSIS — N400 Enlarged prostate without lower urinary tract symptoms: Secondary | ICD-10-CM | POA: Diagnosis present

## 2023-09-01 DIAGNOSIS — J069 Acute upper respiratory infection, unspecified: Secondary | ICD-10-CM | POA: Diagnosis present

## 2023-09-01 DIAGNOSIS — E7800 Pure hypercholesterolemia, unspecified: Secondary | ICD-10-CM | POA: Diagnosis not present

## 2023-09-01 DIAGNOSIS — K921 Melena: Secondary | ICD-10-CM | POA: Diagnosis not present

## 2023-09-01 DIAGNOSIS — Z6831 Body mass index (BMI) 31.0-31.9, adult: Secondary | ICD-10-CM | POA: Diagnosis not present

## 2023-09-01 DIAGNOSIS — K2970 Gastritis, unspecified, without bleeding: Secondary | ICD-10-CM | POA: Diagnosis present

## 2023-09-01 DIAGNOSIS — E875 Hyperkalemia: Secondary | ICD-10-CM | POA: Diagnosis not present

## 2023-09-01 DIAGNOSIS — W1839XA Other fall on same level, initial encounter: Secondary | ICD-10-CM | POA: Diagnosis present

## 2023-09-01 DIAGNOSIS — Y92009 Unspecified place in unspecified non-institutional (private) residence as the place of occurrence of the external cause: Secondary | ICD-10-CM

## 2023-09-01 DIAGNOSIS — N189 Chronic kidney disease, unspecified: Secondary | ICD-10-CM | POA: Diagnosis present

## 2023-09-01 DIAGNOSIS — I129 Hypertensive chronic kidney disease with stage 1 through stage 4 chronic kidney disease, or unspecified chronic kidney disease: Secondary | ICD-10-CM | POA: Diagnosis not present

## 2023-09-01 DIAGNOSIS — T45515A Adverse effect of anticoagulants, initial encounter: Secondary | ICD-10-CM | POA: Diagnosis present

## 2023-09-01 DIAGNOSIS — Z86718 Personal history of other venous thrombosis and embolism: Secondary | ICD-10-CM | POA: Diagnosis not present

## 2023-09-01 DIAGNOSIS — S01111A Laceration without foreign body of right eyelid and periocular area, initial encounter: Secondary | ICD-10-CM | POA: Diagnosis not present

## 2023-09-01 DIAGNOSIS — K5730 Diverticulosis of large intestine without perforation or abscess without bleeding: Secondary | ICD-10-CM | POA: Diagnosis not present

## 2023-09-01 DIAGNOSIS — Y92002 Bathroom of unspecified non-institutional (private) residence single-family (private) house as the place of occurrence of the external cause: Secondary | ICD-10-CM

## 2023-09-01 DIAGNOSIS — S0990XA Unspecified injury of head, initial encounter: Secondary | ICD-10-CM

## 2023-09-01 DIAGNOSIS — R42 Dizziness and giddiness: Secondary | ICD-10-CM | POA: Diagnosis not present

## 2023-09-01 DIAGNOSIS — K3189 Other diseases of stomach and duodenum: Secondary | ICD-10-CM | POA: Diagnosis not present

## 2023-09-01 DIAGNOSIS — I959 Hypotension, unspecified: Secondary | ICD-10-CM | POA: Diagnosis present

## 2023-09-01 DIAGNOSIS — I6529 Occlusion and stenosis of unspecified carotid artery: Secondary | ICD-10-CM | POA: Diagnosis not present

## 2023-09-01 DIAGNOSIS — R0602 Shortness of breath: Secondary | ICD-10-CM | POA: Diagnosis not present

## 2023-09-01 DIAGNOSIS — S199XXA Unspecified injury of neck, initial encounter: Secondary | ICD-10-CM | POA: Diagnosis not present

## 2023-09-01 DIAGNOSIS — E669 Obesity, unspecified: Secondary | ICD-10-CM | POA: Diagnosis present

## 2023-09-01 DIAGNOSIS — K2951 Unspecified chronic gastritis with bleeding: Secondary | ICD-10-CM | POA: Diagnosis not present

## 2023-09-01 DIAGNOSIS — R059 Cough, unspecified: Secondary | ICD-10-CM | POA: Diagnosis not present

## 2023-09-01 DIAGNOSIS — I10 Essential (primary) hypertension: Secondary | ICD-10-CM | POA: Diagnosis not present

## 2023-09-01 DIAGNOSIS — K922 Gastrointestinal hemorrhage, unspecified: Secondary | ICD-10-CM | POA: Diagnosis not present

## 2023-09-01 DIAGNOSIS — K259 Gastric ulcer, unspecified as acute or chronic, without hemorrhage or perforation: Secondary | ICD-10-CM | POA: Diagnosis not present

## 2023-09-01 DIAGNOSIS — K2100 Gastro-esophageal reflux disease with esophagitis, without bleeding: Secondary | ICD-10-CM | POA: Diagnosis present

## 2023-09-01 DIAGNOSIS — W19XXXA Unspecified fall, initial encounter: Secondary | ICD-10-CM | POA: Diagnosis not present

## 2023-09-01 DIAGNOSIS — N179 Acute kidney failure, unspecified: Secondary | ICD-10-CM | POA: Diagnosis present

## 2023-09-01 DIAGNOSIS — I672 Cerebral atherosclerosis: Secondary | ICD-10-CM | POA: Diagnosis not present

## 2023-09-01 LAB — CBC
HCT: 24.8 % — ABNORMAL LOW (ref 39.0–52.0)
Hemoglobin: 7.9 g/dL — ABNORMAL LOW (ref 13.0–17.0)
MCH: 29.4 pg (ref 26.0–34.0)
MCHC: 31.9 g/dL (ref 30.0–36.0)
MCV: 92.2 fL (ref 80.0–100.0)
Platelets: 291 10*3/uL (ref 150–400)
RBC: 2.69 MIL/uL — ABNORMAL LOW (ref 4.22–5.81)
RDW: 13.4 % (ref 11.5–15.5)
WBC: 10.3 10*3/uL (ref 4.0–10.5)
nRBC: 0 % (ref 0.0–0.2)

## 2023-09-01 LAB — BASIC METABOLIC PANEL WITH GFR
Anion gap: 7 (ref 5–15)
BUN: 67 mg/dL — ABNORMAL HIGH (ref 8–23)
CO2: 15 mmol/L — ABNORMAL LOW (ref 22–32)
Calcium: 6.1 mg/dL — CL (ref 8.9–10.3)
Chloride: 117 mmol/L — ABNORMAL HIGH (ref 98–111)
Creatinine, Ser: 1.48 mg/dL — ABNORMAL HIGH (ref 0.61–1.24)
GFR, Estimated: 51 mL/min — ABNORMAL LOW (ref >60)
Glucose, Bld: 108 mg/dL — ABNORMAL HIGH (ref 70–99)
Potassium: 3.9 mmol/L (ref 3.5–5.1)
Sodium: 139 mmol/L (ref 135–145)

## 2023-09-01 LAB — I-STAT CHEM 8, ED
BUN: 82 mg/dL — ABNORMAL HIGH (ref 8–23)
Calcium, Ion: 1.03 mmol/L — ABNORMAL LOW (ref 1.15–1.40)
Chloride: 108 mmol/L (ref 98–111)
Creatinine, Ser: 2.2 mg/dL — ABNORMAL HIGH (ref 0.61–1.24)
Glucose, Bld: 171 mg/dL — ABNORMAL HIGH (ref 70–99)
HCT: 21 % — ABNORMAL LOW (ref 39.0–52.0)
Hemoglobin: 7.1 g/dL — ABNORMAL LOW (ref 13.0–17.0)
Potassium: 5.7 mmol/L — ABNORMAL HIGH (ref 3.5–5.1)
Sodium: 138 mmol/L (ref 135–145)
TCO2: 17 mmol/L — ABNORMAL LOW (ref 22–32)

## 2023-09-01 LAB — HEMOGLOBIN AND HEMATOCRIT, BLOOD
HCT: 25.1 % — ABNORMAL LOW (ref 39.0–52.0)
HCT: 26.6 % — ABNORMAL LOW (ref 39.0–52.0)
HCT: 25.2 % — ABNORMAL LOW (ref 39.0–52.0)
Hemoglobin: 8.5 g/dL — ABNORMAL LOW (ref 13.0–17.0)
Hemoglobin: 9 g/dL — ABNORMAL LOW (ref 13.0–17.0)
Hemoglobin: 8.4 g/dL — ABNORMAL LOW (ref 13.0–17.0)

## 2023-09-01 LAB — URINALYSIS, ROUTINE W REFLEX MICROSCOPIC
Bacteria, UA: NONE SEEN
Bilirubin Urine: NEGATIVE
Glucose, UA: NEGATIVE mg/dL
Ketones, ur: NEGATIVE mg/dL
Leukocytes,Ua: NEGATIVE
Nitrite: NEGATIVE
Protein, ur: NEGATIVE mg/dL
Specific Gravity, Urine: 1.01 (ref 1.005–1.030)
pH: 5 (ref 5.0–8.0)

## 2023-09-01 LAB — PROCALCITONIN: Procalcitonin: 0.56 ng/mL

## 2023-09-01 LAB — RESP PANEL BY RT-PCR (RSV, FLU A&B, COVID)  RVPGX2
Influenza A by PCR: NEGATIVE
Influenza B by PCR: NEGATIVE
Resp Syncytial Virus by PCR: NEGATIVE
SARS Coronavirus 2 by RT PCR: NEGATIVE

## 2023-09-01 LAB — COMPREHENSIVE METABOLIC PANEL WITH GFR
ALT: 41 U/L (ref 0–44)
AST: 32 U/L (ref 15–41)
Albumin: 2 g/dL — ABNORMAL LOW (ref 3.5–5.0)
Alkaline Phosphatase: 43 U/L (ref 38–126)
Anion gap: 7 (ref 5–15)
BUN: 77 mg/dL — ABNORMAL HIGH (ref 8–23)
CO2: 17 mmol/L — ABNORMAL LOW (ref 22–32)
Calcium: 7.1 mg/dL — ABNORMAL LOW (ref 8.9–10.3)
Chloride: 113 mmol/L — ABNORMAL HIGH (ref 98–111)
Creatinine, Ser: 2.07 mg/dL — ABNORMAL HIGH (ref 0.61–1.24)
GFR, Estimated: 34 mL/min — ABNORMAL LOW (ref >60)
Glucose, Bld: 182 mg/dL — ABNORMAL HIGH (ref 70–99)
Potassium: 5.7 mmol/L — ABNORMAL HIGH (ref 3.5–5.1)
Sodium: 137 mmol/L (ref 135–145)
Total Bilirubin: 0.5 mg/dL (ref 0.0–1.2)
Total Protein: 4.4 g/dL — ABNORMAL LOW (ref 6.5–8.1)

## 2023-09-01 LAB — PROTIME-INR
INR: 4.5 (ref 0.8–1.2)
INR: 1.9 — ABNORMAL HIGH (ref 0.8–1.2)
Prothrombin Time: 42.7 s — ABNORMAL HIGH (ref 11.4–15.2)
Prothrombin Time: 22.2 s — ABNORMAL HIGH (ref 11.4–15.2)

## 2023-09-01 LAB — HIV ANTIBODY (ROUTINE TESTING W REFLEX): HIV Screen 4th Generation wRfx: NONREACTIVE

## 2023-09-01 LAB — PREPARE RBC (CROSSMATCH)

## 2023-09-01 LAB — I-STAT CG4 LACTIC ACID, ED: Lactic Acid, Venous: 1.2 mmol/L (ref 0.5–1.9)

## 2023-09-01 LAB — ETHANOL: Alcohol, Ethyl (B): <15 mg/dL (ref <15)

## 2023-09-01 LAB — CK: Total CK: 30 U/L — ABNORMAL LOW (ref 49–397)

## 2023-09-01 LAB — ABO/RH: ABO/RH(D): A NEG

## 2023-09-01 MED ORDER — VITAMIN K1 10 MG/ML IJ SOLN: 5.0000 mg | Freq: Once | INTRAVENOUS | Status: DC

## 2023-09-01 MED ORDER — PANTOPRAZOLE SODIUM 40 MG IV SOLR
40.0000 mg | Freq: Two times a day (BID) | INTRAVENOUS | Status: DC
  Administered 2023-09-01 – 2023-09-04 (×6): 40 mg via INTRAVENOUS
  Filled 2023-09-01 (×6): qty 10

## 2023-09-01 MED ORDER — CALCIUM GLUCONATE-NACL 2-0.675 GM/100ML-% IV SOLN
2.0000 g | Freq: Once | INTRAVENOUS | Status: AC
  Administered 2023-09-01: 2000 mg via INTRAVENOUS
  Filled 2023-09-01 (×2): qty 100

## 2023-09-01 MED ORDER — ACETAMINOPHEN 325 MG PO TABS: 650.0000 mg | ORAL_TABLET | Freq: Four times a day (QID) | ORAL | Status: DC | PRN

## 2023-09-01 MED ORDER — AZITHROMYCIN 250 MG PO TABS
250.0000 mg | ORAL_TABLET | Freq: Once | ORAL | Status: AC
  Administered 2023-09-01: 250 mg via ORAL
  Filled 2023-09-01: qty 1

## 2023-09-01 MED ORDER — SODIUM CHLORIDE 0.9% FLUSH
3.0000 mL | Freq: Two times a day (BID) | INTRAVENOUS | Status: DC
  Administered 2023-09-01 – 2023-09-04 (×7): 3 mL via INTRAVENOUS

## 2023-09-01 MED ORDER — PANTOPRAZOLE SODIUM 40 MG IV SOLR: 40.0000 mg | INTRAVENOUS | Status: AC

## 2023-09-01 MED ORDER — VITAMIN K1 10 MG/ML IJ SOLN
2.5000 mg | Freq: Once | INTRAMUSCULAR | Status: AC
  Administered 2023-09-01: 2.5 mg via INTRAVENOUS
  Filled 2023-09-01: qty 0.25

## 2023-09-01 MED ORDER — ACETAMINOPHEN 650 MG RE SUPP: 650.0000 mg | Freq: Four times a day (QID) | RECTAL | Status: DC | PRN

## 2023-09-01 MED ORDER — ONDANSETRON HCL 4 MG PO TABS: 4.0000 mg | ORAL_TABLET | Freq: Four times a day (QID) | ORAL | Status: DC | PRN

## 2023-09-01 MED ORDER — SODIUM CHLORIDE 0.9 % IV SOLN: INTRAVENOUS | Status: DC

## 2023-09-01 MED ORDER — PANTOPRAZOLE SODIUM 40 MG IV SOLR: 40.0000 mg | Freq: Two times a day (BID) | INTRAVENOUS | Status: DC

## 2023-09-01 MED ORDER — LACTATED RINGERS IV BOLUS
1000.0000 mL | Freq: Once | INTRAVENOUS | Status: AC
  Administered 2023-09-01: 1000 mL via INTRAVENOUS

## 2023-09-01 MED ORDER — SODIUM CHLORIDE 0.9% IV SOLUTION: Freq: Once | INTRAVENOUS | Status: AC

## 2023-09-01 MED ORDER — ALBUTEROL SULFATE (2.5 MG/3ML) 0.083% IN NEBU: 2.5000 mg | INHALATION_SOLUTION | Freq: Four times a day (QID) | RESPIRATORY_TRACT | Status: DC | PRN

## 2023-09-01 MED ORDER — ONDANSETRON HCL 4 MG/2ML IJ SOLN: 4.0000 mg | Freq: Four times a day (QID) | INTRAMUSCULAR | Status: DC | PRN

## 2023-09-01 MED ORDER — SODIUM CHLORIDE 0.9 % IV BOLUS
1000.0000 mL | Freq: Once | INTRAVENOUS | Status: AC
  Administered 2023-09-01: 1000 mL via INTRAVENOUS

## 2023-09-01 NOTE — Consult Note (Addendum)
 Attending physician's note   I have taken a history, reviewed the chart, and examined the patient. I performed a substantive portion of this encounter, including complete performance of at least one of the key components, in conjunction with the APP. I agree with the APP's note, impression, and recommendations with my edits.  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  He was recently treated with Z-Pak for infectious symptoms.  Patient reports painless hematochezia yesterday evening with BP 98/59 on arrival to the ER.  Admission evaluation notable for the following:  - H/H 7.1/21 --> 7.9/24.8 - INR 4.5 - BUN/creatinine 82/2.2  --> 77/2.1 - Normal lactate, normal liver enzymes  1) Hematochezia 2) Supratherapeutic INR 3) Acute blood loss anemia 4) Symptomatic anemia Possibly acute diverticular bleed in the setting of supratherapeutic INR (4.5).  INR potentially up due to recent antibiotics, poor p.o. intake, etc. - Trend serial CBCs with blood products as needed per protocol - Holding Coumadin - Repeat PT/INR check - If any further bleeding, stat CTA to try to localize bleed with IR consult if positive  Carlin Lindau, DO, FACG [phone] office         Consultation  Referring Provider: [facility] Primary Care Physician:  Pete Brand, DO Primary Gastroenterologist:  Dr.Stark/previous  Reason for Consultation: Acute GI bleed  HPI: HOLT LEARD is a 73 y.o. male, who is on chronic Coumadin in setting of previous unprovoked DVT and PE, also with history of chronic kidney disease, diverticulosis, BPH and colon polyps. Patient relates that he had been feeling poorly last week, with muscle aching, fever to 101 at home, cough and some headache.  He had been evaluated by primary care and was given a Z-Pak  apparently on Thursday. He had acute onset last night around 6 PM with a grossly bloody bowel movement that was not associated with any abdominal pain or cramping, no diaphoresis etc.  He had 2-3 further episodes, fell on the floor in the bathroom, his wife tried to get him up and then he passed out and then was incontinent of what he describes as a large amount of dark red blood. He was brought to the emergency room, blood pressure 98/59 on arrival, noted to have a 1 cm laceration over his right eyebrow, and bloody stool on his legs clothing.  Initial labs showed hemoglobin 7.9/hematocrit 24.8 Pro time 42/INR 4.5 BUN 77/creatinine 2.07/albumin 2.0 Respiratory panel negative Lactate 1.2  Fortunately he has not had any further bowel movements or bleeding since arrival to the emergency room, overall is feeling a bit better is thirsty, no complaints of abdominal pain, no nausea. No prior history of GI bleeding.  Repeat hemoglobin this morning 8.5/hematocrit 25.1 Potassium 3.9/BUN 67/creatinine 1.48/calcium 6.1  He has not had abdominal imaging since arrival  CT head and cervical spine showed no acute injuries. Chest x-ray negative.  Patient last had colonoscopy in May 2021 Dr. Sandrea Cruel 2 polyps were removed largest 9 mm and was noted to have multiple diverticuli throughout the left colon and internal hemorrhoids.  Path on the polyps showed 1 of these to be a tubular adenoma and the other was a leiomyoma.  No prior EGD.   Past Medical History:  Diagnosis Date   Adenomatous polyp of colon 10/2004   BPH (benign prostatic hyperplasia)    Chronic kidney disease, stage 3 (  HCC)    Deep vein thrombosis (DVT) (HCC)    Diverticulosis    Hypertension    Hypothyroidism    Pulmonary embolism (HCC)    Pure hypercholesterolemia    Vitamin D deficiency     Past Surgical History:  Procedure Laterality Date   NO PAST SURGERIES      Prior to Admission medications   Medication Sig Start Date End  Date Taking? Authorizing Provider  amLODipine (NORVASC) 2.5 MG tablet Take 2.5 mg by mouth at bedtime. 06/28/23  Yes [provider]  Ascorbic Acid (VITAMIN C) 1000 MG tablet Take 1,000 mg by mouth daily.   Yes [provider]  azithromycin (ZITHROMAX) 250 MG tablet Take by mouth. 08/27/23  Yes [provider]  B Complex Vitamins (B COMPLEX PO) Take 1 tablet by mouth daily.   Yes [provider]  benzonatate (TESSALON) 200 MG capsule Take 200 mg by mouth 3 (three) times daily as needed. 08/27/23  Yes [provider]  Cholecalciferol (VITAMIN D3) 50 MCG (2000 UT) capsule Take 2,000 Units by mouth daily.   Yes [provider]  losartan (COZAAR) 100 MG tablet Take 100 mg by mouth daily. 07/25/19  Yes [provider]  MAGNESIUM PO Take 1 tablet by mouth daily.   Yes [provider]  warfarin (COUMADIN) 5 MG tablet Take one tablet (5 mg) by mouth every day except Weds.  Take one and one-half tablet (7.5mg ) by mouth on Weds. 06/14/19  Yes [provider]    Current Facility-Administered Medications  Medication Dose Route Frequency Provider Last Rate Last Admin   0.9 %  sodium chloride  infusion   Intravenous Continuous Lena Qualia, MD 75 mL/hr at 09/01/23 0912 New Bag at 09/01/23 0912   acetaminophen (TYLENOL) tablet 650 mg  650 mg Oral Q6H PRN Smith, Rondell A, MD       Or   acetaminophen (TYLENOL) suppository 650 mg  650 mg Rectal Q6H PRN Smith, Rondell A, MD       albuterol (PROVENTIL) (2.5 MG/3ML) 0.083% nebulizer solution 2.5 mg  2.5 mg Nebulization Q6H PRN Felipe Horton, Rondell A, MD       calcium gluconate 2 g/ 100 mL sodium chloride  IVPB  2 g Intravenous Once Smith, Rondell A, MD 100 mL/hr at 09/01/23 1137 2,000 mg at 09/01/23 1137   ondansetron (ZOFRAN) tablet 4 mg  4 mg Oral Q6H PRN Smith, Rondell A, MD       Or   ondansetron (ZOFRAN) injection 4 mg  4 mg Intravenous Q6H PRN Felipe Horton, Rondell A, MD       pantoprazole  (PROTONIX) injection 40 mg  40 mg Intravenous Q12H Smith, Rondell A, MD       sodium chloride  flush (NS) 0.9 % injection 3 mL  3 mL Intravenous Q12H Smith, Rondell A, MD   3 mL at 09/01/23 1029   Current Outpatient Medications  Medication Sig Dispense Refill   amLODipine (NORVASC) 2.5 MG tablet Take 2.5 mg by mouth at bedtime.     Ascorbic Acid (VITAMIN C) 1000 MG tablet Take 1,000 mg by mouth daily.     azithromycin (ZITHROMAX) 250 MG tablet Take by mouth.     B Complex Vitamins (B COMPLEX PO) Take 1 tablet by mouth daily.     benzonatate (TESSALON) 200 MG capsule Take 200 mg by mouth 3 (three) times daily as needed.     Cholecalciferol (VITAMIN D3) 50 MCG (2000 UT) capsule Take 2,000 Units by mouth  daily.     losartan (COZAAR) 100 MG tablet Take 100 mg by mouth daily.     MAGNESIUM PO Take 1 tablet by mouth daily.     warfarin (COUMADIN) 5 MG tablet Take one tablet (5 mg) by mouth every day except Weds.  Take one and one-half tablet (7.5mg ) by mouth on Weds.      Allergies as of 09/01/2023   (No Known Allergies)    Family History  Problem Relation Age of Onset   Stroke Father    Hypertension Father    Heart attack Brother    Heart disease Maternal Grandfather    Diabetes Paternal Grandmother    Heart disease Paternal Grandfather     Social History   Socioeconomic History   Marital status: Married    Spouse name: Not on file   Number of children: 2   Years of education: Not on file   Highest education level: Not on file  Occupational History   Occupation: retired  Tobacco Use   Smoking status: Some Days    Types: Cigars   Smokeless tobacco: Never   Tobacco comments:    Occasionally smoke a cigar  Vaping Use   Vaping status: Never Used  Substance and Sexual Activity   Alcohol use: Yes    Comment: 1 per day - occasional beer   Drug use: Never   Sexual activity: Not on file  Other Topics Concern   Not on file  Social History Narrative   Not on file   Social  Drivers of Health   Financial Resource Strain: Not on file  Food Insecurity: Not on file  Transportation Needs: Not on file  Physical Activity: Not on file  Stress: Not on file  Social Connections: Not on file  Intimate Partner Violence: Not on file    Review of Systems: Pertinent positive and negative review of systems were noted in the above HPI section.  All other review of systems was otherwise negative.   Physical Exam: Vital signs in last 24 hours: Temp:  [97.1 F (36.2 C)-98.6 F (37 C)] 98.6 F (37 C) (05/06 0752) Pulse Rate:  [40-76] 70 (05/06 1055) Resp:  [13-21] 18 (05/06 1055) BP: (87-114)/(49-69) 112/69 (05/06 1055) SpO2:  [99 %-100 %] 100 % (05/06 1055)   General:   Alert,  Well-developed,older WM  well-nourished, pleasant and cooperative in NAD, fatigued appearing, pale, laceration on forehead Head:  Normocephalic and atraumatic. Eyes:  Sclera clear, no icterus.   Conjunctiva pale Ears:  Normal auditory acuity. Nose:  No deformity, discharge,  or lesions. Mouth:  No deformity or lesions.   Neck:  Supple; no masses or thyromegaly. Lungs:  Clear throughout to auscultation.   No wheezes, crackles, or rhonchi.  Heart:  Regular rate and rhythm; no murmurs, clicks, rubs,  or gallops. Abdomen:  Soft,nontender, BS active,nonpalp mass or hsm.   Rectal: Not done and bloody stool noted on patient's legs on arrival Msk:  Symmetrical without gross deformities. . Pulses:  Normal pulses noted. Extremities:  Without clubbing or edema. Neurologic:  Alert and  oriented x4;  grossly normal neurologically. Skin:  Intact without significant lesions or rashes.. Psych:  Alert and cooperative. Normal mood and affect.  Intake/Output from previous day: 05/05 0701 - 05/06 0700 In: 329.2 [Blood:329.2] Out: -  Intake/Output this shift: No intake/output data recorded.  Lab Results: Recent Labs    09/01/23 0200 09/01/23 0201 09/01/23 1025  WBC  --  10.3  --   HGB  7.1* 7.9*  8.5*  HCT 21.0* 24.8* 25.1*  PLT  --  291  --    BMET Recent Labs    09/01/23 0200 09/01/23 0201 09/01/23 1025  NA 138 137 139  K 5.7* 5.7* 3.9  CL 108 113* 117*  CO2  --  17* 15*  GLUCOSE 171* 182* 108*  BUN 82* 77* 67*  CREATININE 2.20* 2.07* 1.48*  CALCIUM  --  7.1* 6.1*   LFT Recent Labs    09/01/23 0201  PROT 4.4*  ALBUMIN 2.0*  AST 32  ALT 41  ALKPHOS 43  BILITOT 0.5   PT/INR Recent Labs    09/01/23 0201  LABPROT 42.7*  INR 4.5*    IMPRESSION:  #38 73 year old white male with acute GI bleed in setting of supratherapeutic INR of 4.5 Likely lower GI bleed secondary to known diverticulosis. Has not had any active bleeding since arrival to the emergency room  #2 anemia acute secondary to acute GI blood loss #3 supratherapeutic INR possibly secondary to recent Z-Pak #4 chronic anticoagulation with Coumadin with history of unprovoked DVT/PE #5 history of adenomatous colon polyps-last colonoscopy May 2021 #6.  BPH #7.  Chronic kidney disease  Plan; Clear liquid diet Hold Coumadin and allow INR to normalize If any further evidence of active bleeding INR may need to be reversed( did receive one dose of VitK 2.5) Trend hemoglobin every 6 hours, transfuse for hemoglobin 7.5 or less due to symptomatic state and acute GI blood loss Further active bleeding would also plan to send for stat CTA and if CTA positive then to IR for angiography and embolization. GI will follow with you      Amy EsterwoodPA-C  09/01/2023, 11:52 AM

## 2023-09-01 NOTE — ED Provider Notes (Signed)
 [facility] EMERGENCY DEPARTMENT AT [facility] Provider Note  CSN: [id number] Arrival date & time: 09/01/23 0142  Chief Complaint(s) FOT  HPI Hunter Sandoval is a 73 y.o. 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  Patient reports being treated for flulike symptoms and was placed on Z-Pak on Thursday.  He developed diarrhea earlier this afternoon.  This evening, patient got up from bed and fell forward on the way to the bathroom.  Patient did report feeling dizzy at that time.  There was no loss of consciousness.  He was able to stand and get to the bathroom with assistance.  In the bathroom, he felt lightheaded again and was lowered.  No additional trauma.  EMS was called who reported patient was incontinent in and around with bloody bowel movement.  Initial blood pressure was normotensive and patient received 1 L IV fluids.  Repeat BP of 80s over 40s.  Patient has not denied any headache, neck pain, abdominal pain, chest pain.  The history is provided by the patient and the EMS personnel.    Past Medical History Past Medical History:  Diagnosis Date   Adenomatous polyp of colon 10/2004   BPH (benign prostatic hyperplasia)    Chronic kidney disease, stage 3 (HCC)    Deep vein thrombosis (DVT) (HCC)    Diverticulosis    Hypertension    Hypothyroidism    Pulmonary embolism (HCC)    Pure hypercholesterolemia    Vitamin D deficiency    Patient Active Problem List   Diagnosis Date Noted   PULMONARY EMBOLISM 10/24/2009   TINEA PEDIS 11/29/2007   Essential hypertension 11/29/2007   PSORIASIS 11/29/2007   ELEVATED PROSTATE SPECIFIC ANTIGEN 11/29/2007   Home Medication(s) Prior to Admission medications   Medication Sig Start Date End Date Taking? Authorizing Provider  Ascorbic Acid (VITAMIN C) 1000 MG tablet Take 1,000 mg by mouth daily.     [provider]  B Complex Vitamins (B COMPLEX PO) Take 1 tablet by mouth daily.    [provider]  Cholecalciferol (VITAMIN D3 PO) Take 1 tablet by mouth daily.    [provider]  ciclopirox  (PENLAC ) 8 % solution Apply topically at bedtime. Apply over nail and surrounding skin. Apply daily over previous coat. After seven (7) days, may remove with alcohol and continue cycle. 06/07/20   McDonald, Olive Better, DPM  ketoconazole  (NIZORAL ) 2 % cream Apply 1 application topically daily. 03/05/20   McDonald, Olive Better, DPM  losartan (COZAAR) 100 MG tablet Take 100 mg by mouth daily. 07/25/19   [provider]  MAGNESIUM PO Take 1 tablet by mouth daily.    [provider]  warfarin (COUMADIN) 5 MG tablet 5 mg by mouth every day 7.5 on Wed 06/14/19   [provider]  Allergies Patient has no known allergies.  Review of Systems Review of Systems As noted in HPI  Physical Exam Vital Signs  I have reviewed the triage vital signs BP (!) 98/59   Pulse 64   Temp (!) 97.1 F (36.2 C) (Axillary)   Resp 17   SpO2 100%   Physical Exam Constitutional:      General: He is not in acute distress.    Appearance: He is well-developed. He is not diaphoretic.  HENT:     Head: Normocephalic. Laceration (Approx 1cm over right eyebrow. hemostatic) present.     Right Ear: External ear normal.     Left Ear: External ear normal.  Eyes:     General: No scleral icterus.       Right eye: No discharge.        Left eye: No discharge.     Conjunctiva/sclera: Conjunctivae normal.     Pupils: Pupils are equal, round, and reactive to light.  Cardiovascular:     Rate and Rhythm: Regular rhythm.     Pulses:          Radial pulses are 2+ on the right side and 2+ on the left side.       Dorsalis pedis pulses are 2+ on the right side and 2+ on the  left side.     Heart sounds: Normal heart sounds. No murmur heard.    No friction rub. No gallop.  Pulmonary:     Effort: Pulmonary effort is normal. No respiratory distress.     Breath sounds: Normal breath sounds. No stridor.  Abdominal:     General: There is no distension.     Palpations: Abdomen is soft.     Tenderness: There is no abdominal tenderness.     Hernia: A hernia is present. Hernia is present in the umbilical area.  Genitourinary:    Comments: Bloody stool on patient's legs and clothing Musculoskeletal:     Cervical back: Normal range of motion and neck supple. No bony tenderness.     Thoracic back: No bony tenderness.     Lumbar back: No bony tenderness.     Comments: Clavicle stable. Chest stable to AP/Lat compression. Pelvis stable to Lat compression. No obvious extremity deformity. No chest or abdominal wall contusion.  Skin:    General: Skin is warm.  Neurological:     Mental Status: He is alert and oriented to person, place, and time.     GCS: GCS eye subscore is 4. GCS verbal subscore is 5. GCS motor subscore is 6.     Comments: Moving all extremities      ED Results and Treatments Labs (all labs ordered are listed, but only abnormal results are displayed) Labs Reviewed  COMPREHENSIVE METABOLIC PANEL WITH GFR - Abnormal; Notable for the following components:      Result Value   Potassium 5.7 (*)    Chloride 113 (*)    CO2 17 (*)    Glucose, Bld 182 (*)    BUN 77 (*)    Creatinine, Ser 2.07 (*)    Calcium 7.1 (*)    Total Protein 4.4 (*)    Albumin 2.0 (*)    GFR, Estimated 34 (*)    All other components within normal limits  CBC - Abnormal; Notable for the following components:   RBC 2.69 (*)    Hemoglobin 7.9 (*)    HCT 24.8 (*)    All other components within normal limits  URINALYSIS,  ROUTINE W REFLEX MICROSCOPIC - Abnormal; Notable for the following components:   Color, Urine STRAW (*)    Hgb urine dipstick MODERATE (*)    All other  components within normal limits  PROTIME-INR - Abnormal; Notable for the following components:   Prothrombin Time 42.7 (*)    INR 4.5 (*)    All other components within normal limits  I-STAT CHEM 8, ED - Abnormal; Notable for the following components:   Potassium 5.7 (*)    BUN 82 (*)    Creatinine, Ser 2.20 (*)    Glucose, Bld 171 (*)    Calcium, Ion 1.03 (*)    TCO2 17 (*)    Hemoglobin 7.1 (*)    HCT 21.0 (*)    All other components within normal limits  ETHANOL  I-STAT CG4 LACTIC ACID, ED  TYPE AND SCREEN  ABO/RH  PREPARE RBC (CROSSMATCH)                                                                                                                         EKG  EKG Interpretation Date/Time:  Tuesday Sep 01 2023 01:59:32 EDT Ventricular Rate:  70 PR Interval:  139 QRS Duration:  84 QT Interval:  393 QTC Calculation: 424 R Axis:   37  Text Interpretation: Sinus rhythm Borderline T abnormalities, inferior leads Confirmed by Townsend Freud [phone]) on 09/01/2023 2:01:18 AM       Radiology CT HEAD WO CONTRAST Result Date: 09/01/2023 CLINICAL DATA:  Blunt polytrauma, moderate to severe head trauma. Fell and hit head. Dizziness. EXAM: CT HEAD WITHOUT CONTRAST CT CERVICAL SPINE WITHOUT CONTRAST TECHNIQUE: Multidetector CT imaging of the head and cervical spine was performed following the standard protocol without intravenous contrast. Multiplanar CT image reconstructions of the cervical spine were also generated. RADIATION DOSE REDUCTION: This exam was performed according to the departmental dose-optimization program which includes automated exposure control, adjustment of the mA and/or kV according to patient size and/or use of iterative reconstruction technique. COMPARISON:  None Available. FINDINGS: CT HEAD FINDINGS Brain: No intracranial hemorrhage, mass effect, or evidence of acute infarct. No hydrocephalus. No extra-axial fluid collection. Age related cerebral atrophy and chronic  small vessel ischemic disease. Vascular: No hyperdense vessel. Intracranial arterial calcification. Skull: No fracture or focal lesion. Sinuses/Orbits: No acute finding. Other: None. CT CERVICAL SPINE FINDINGS Alignment: No evidence of traumatic malalignment. Skull base and vertebrae: No acute fracture. No primary bone lesion or focal pathologic process. Soft tissues and spinal canal: No prevertebral fluid or swelling. No visible canal hematoma. Disc levels: Multilevel spondylosis, disc space height loss, and degenerative endplate changes greatest at C3-C4, C4-C5, and C5-C6 where it is moderate. Posterior disc osteophyte complexes cause multilevel effacement of the ventral thecal sac greatest at C3-C4 where it is mild-to-moderate. Upper chest: No acute abnormality. Other: Carotid calcification. IMPRESSION: 1. No acute intracranial abnormality. 2. No acute fracture in the cervical spine. Electronically Signed   By: Christell Cove.D.  On: 09/01/2023 02:25   CT CERVICAL SPINE WO CONTRAST Result Date: 09/01/2023 CLINICAL DATA:  Blunt polytrauma, moderate to severe head trauma. Fell and hit head. Dizziness. EXAM: CT HEAD WITHOUT CONTRAST CT CERVICAL SPINE WITHOUT CONTRAST TECHNIQUE: Multidetector CT imaging of the head and cervical spine was performed following the standard protocol without intravenous contrast. Multiplanar CT image reconstructions of the cervical spine were also generated. RADIATION DOSE REDUCTION: This exam was performed according to the departmental dose-optimization program which includes automated exposure control, adjustment of the mA and/or kV according to patient size and/or use of iterative reconstruction technique. COMPARISON:  None Available. FINDINGS: CT HEAD FINDINGS Brain: No intracranial hemorrhage, mass effect, or evidence of acute infarct. No hydrocephalus. No extra-axial fluid collection. Age related cerebral atrophy and chronic small vessel ischemic disease. Vascular: No  hyperdense vessel. Intracranial arterial calcification. Skull: No fracture or focal lesion. Sinuses/Orbits: No acute finding. Other: None. CT CERVICAL SPINE FINDINGS Alignment: No evidence of traumatic malalignment. Skull base and vertebrae: No acute fracture. No primary bone lesion or focal pathologic process. Soft tissues and spinal canal: No prevertebral fluid or swelling. No visible canal hematoma. Disc levels: Multilevel spondylosis, disc space height loss, and degenerative endplate changes greatest at C3-C4, C4-C5, and C5-C6 where it is moderate. Posterior disc osteophyte complexes cause multilevel effacement of the ventral thecal sac greatest at C3-C4 where it is mild-to-moderate. Upper chest: No acute abnormality. Other: Carotid calcification. IMPRESSION: 1. No acute intracranial abnormality. 2. No acute fracture in the cervical spine. Electronically Signed   By: Rozell Cornet M.D.   On: 09/01/2023 02:25    Medications Ordered in ED Medications  0.9 %  sodium chloride  infusion (Manually program via Guardrails IV Fluids) (has no administration in time range)  sodium chloride  0.9 % bolus 1,000 mL (0 mLs Intravenous Stopped 09/01/23 0250)  lactated ringers bolus 1,000 mL (0 mLs Intravenous Stopped 09/01/23 0250)   Procedures .Critical Care  Performed by: Lindle Rhea, MD Authorized by: Lindle Rhea, MD   Critical care provider statement:    Critical care time (minutes):  54   Critical care time was exclusive of:  Separately billable procedures and treating other patients   Critical care was necessary to treat or prevent imminent or life-threatening deterioration of the following conditions:  Circulatory failure   Critical care was time spent personally by me on the following activities:  Development of treatment plan with patient or surrogate, discussions with consultants, evaluation of patient's response to treatment, examination of patient, obtaining history from patient or  surrogate, review of old charts, re-evaluation of patient's condition, pulse oximetry, ordering and review of radiographic studies, ordering and review of laboratory studies and ordering and performing treatments and interventions   Care discussed with: admitting provider     (including critical care time) Medical Decision Making / ED Course   Medical Decision Making Amount and/or Complexity of Data Reviewed Labs: ordered. Decision-making details documented in ED Course. Radiology: ordered and independent interpretation performed. Decision-making details documented in ED Course. ECG/medicine tests: ordered and independent interpretation performed. Decision-making details documented in ED Course.  Risk Prescription drug management. Decision regarding hospitalization.     Clinical Course as of 09/01/23 0440  Tue Sep 01, 2023  0227 Level 2 trauma fall on thinners. Airway and breathing intact Soft blood pressures with systolics in the 90-100s Secondary as above.  Follow-up with due to near syncope likely from hypotension secondary to GI bleed. Patient is anticoagulated on Coumadin.  Since hypotension  is likely due to GI bleed, we did not upgrade to a level 1 trauma.   2 L IV fluids given. Will likely need blood transfusion.  CT head and cervical spine ordered.  [PC]  0230 CT head and cervical spine negative.  [PC]  0230 Hemoglobin of 7.9.  2 units of PRBCs ordered.  [PC]  [id number] Metabolic panel with mild hyperkalemia at 5.7.  Should improve with IV fluids given.  Renal insufficiency with a creatinine of 2.07.  Unsure of prior but patient does have a history of stage III CKD.  BUN elevated to 77.  This could be a combination of CKD with GI bleed.  INR 4.5  BP improving with transfusion.  No active bleeding since arrival. Will plan to hold coumadin. No need for acute reversal at this time.  Laceration irrigated and dermabonded.  Consult to medicine for admission. Direct  message to [facility] team  [PC]    Clinical Course User Index [PC] Tuleen Mandelbaum, Camila Cecil, MD    Final Clinical Impression(s) / ED Diagnoses Final diagnoses:  ABLA (acute blood loss anemia)  Acute GI bleeding  Fall in home, initial encounter  Minor head injury, initial encounter  Facial laceration, initial encounter  Supratherapeutic INR    This chart was dictated using voice recognition software.  Despite best efforts to proofread,  errors can occur which can change the documentation meaning.    Lindle Rhea, MD 09/01/23 [phone]

## 2023-09-01 NOTE — Progress Notes (Signed)
   09/01/23 0149  Spiritual Encounters  Type of Visit Initial  Care provided to: Pt not available  Referral source Trauma page  Reason for visit Trauma  OnCall Visit No  Advance Directives (For Healthcare)  Does Patient Have a Medical Advance Directive? No  Would patient like information on creating a medical advance directive? No - Patient declined  Mental Health Advance Directives  Does Patient Have a Mental Health Advance Directive? No  Would patient like information on creating a mental health advance directive? No - Patient declined   Chaplain responded to a level two trauma. The patient was attended to by the medical team.  No family is present. If a chaplain is requested someone will respond.   Clarence Croak [facility]  [phone]

## 2023-09-01 NOTE — ED Provider Notes (Signed)
.  Laceration Repair  Date/Time: 09/01/2023 4:43 AM  Performed by: Kendrick Pax, PA-C Authorized by: Kendrick Pax, PA-C   Consent:    Consent obtained:  Verbal   Consent given by:  Patient   Risks, benefits, and alternatives were discussed: yes     Risks discussed:  Infection, pain, need for additional repair, poor cosmetic result and poor wound healing   Alternatives discussed:  No treatment Universal protocol:    Procedure explained and questions answered to patient or proxy's satisfaction: yes     Patient identity confirmed:  Verbally with patient Anesthesia:    Anesthesia method:  None Laceration details:    Location:  Face   Face location:  R eyebrow   Length (cm):  1 Treatment:    Area cleansed with:  Chlorhexidine   Amount of cleaning:  Standard   Irrigation solution:  Sterile saline   Irrigation method:  Syringe   Visualized foreign bodies/material removed: no   Skin repair:    Repair method:  Tissue adhesive Approximation:    Approximation:  Close Repair type:    Repair type:  Simple Post-procedure details:    Dressing:  Open (no dressing)   Procedure completion:  Tolerated well, no immediate complications     Kendrick Pax, PA-C 09/01/23 0445    Lindle Rhea, MD 09/01/23 [phone]

## 2023-09-01 NOTE — H&P (Signed)
 History and Physical    Patient: Hunter Sandoval DOB: [birth year] DOA: 09/01/2023 DOS: the patient was seen and examined on 09/01/2023 PCP: Pete Brand, DO  Patient coming from: Home via EMS  Chief Complaint:  Chief Complaint  Patient presents with   Fall at home   HPI: Hunter Sandoval is a 73 y.o. male with medical history significant of hypertension, hyperlipidemia, history of DVT/PE on anticoagulation, CKD stage III, hypothyroidism, BPH, and obesity presents with lightheadedness and a fall.  Reports a 10-day history of having a upper respiratory illness, including achy joints, fatigue, anorexia, and a productive cough. He began antibiotics and cough medicine on Thursday of last week.  His fever had reached nearly 103F before starting antibiotics, which subsequently reduced the fever. He has been taking cold and flu medicine that possibly has ibuprofen, likely two a day, for joint aches and pains other symptoms.  He experienced lightheadedness and fell last night while attempting to go to the bathroom.  Patient did hit the right side of his head and thinks that if he lost consciousness it was for brief second.  He had been feeling lightheaded prior to the fall and had difficulty returning to the bathroom on previous occasions. After the fall, he realized he had soiled himself and his wife called EMS  He reports diarrhea that began yesterday around 6 PM, noting that the stool was black. No stomach pain or discomfort aside from fatigue and achy joints. He is on warfarin and has no history of bleeding issues while on this medication before.  He reports having a recent colonoscopy with [facility].  Last dose of Coumadin was yesterday morning.  Normally INRs are right around 2.  En route with EMS patient was noted to have had an episode incontinence of bloody stool that was all over himself.  Blood pressures as low as 79/48.  Patient was given a total 1 L of IV fluids after IV access  obtained prior to arrival.  In the emergency department patient was noted to be afebrile with initial blood pressures 87/55 -114/69, and all other vital signs relatively maintained.  Labs significant for hemoglobin 7.9, potassium 5.7, chloride 113, CO2 17, BUN 77, creatinine 2.07, glucose 182, calcium 7.1, anion gap 7, alcohol level undetectable, and INR 4.5.  CT scan of the head and cervical spine showed no acute intracranial or cervical abnormality. Patient was typed and screened with orders placed to transfuse 2 units of packed red blood cells.   GI have been consulted.  Blood pressures  Review of Systems: As mentioned in the history of present illness. All other systems reviewed and are negative. Past Medical History:  Diagnosis Date   Adenomatous polyp of colon 10/2004   BPH (benign prostatic hyperplasia)    Chronic kidney disease, stage 3 (HCC)    Deep vein thrombosis (DVT) (HCC)    Diverticulosis    Hypertension    Hypothyroidism    Pulmonary embolism (HCC)    Pure hypercholesterolemia    Vitamin D deficiency    Past Surgical History:  Procedure Laterality Date   NO PAST SURGERIES     Social History:  reports that he has been smoking cigars. He has never used smokeless tobacco. He reports current alcohol use. He reports that he does not use drugs.  No Known Allergies  Family History  Problem Relation Age of Onset   Stroke Father    Hypertension Father    Heart attack Brother  Heart disease Maternal Grandfather    Diabetes Paternal Grandmother    Heart disease Paternal Grandfather     Prior to Admission medications   Medication Sig Start Date End Date Taking? Authorizing Provider  amLODipine (NORVASC) 2.5 MG tablet Take 2.5 mg by mouth at bedtime. 06/28/23  Yes [provider]  Ascorbic Acid (VITAMIN C) 1000 MG tablet Take 1,000 mg by mouth daily.   Yes [provider]  azithromycin (ZITHROMAX) 250 MG tablet Take by mouth. 08/27/23  Yes [provider]  B Complex Vitamins (B COMPLEX PO) Take 1 tablet by mouth daily.   Yes [provider]  benzonatate (TESSALON) 200 MG capsule Take 200 mg by mouth 3 (three) times daily as needed. 08/27/23  Yes [provider]  Cholecalciferol (VITAMIN D3) 50 MCG (2000 UT) capsule Take 2,000 Units by mouth daily.   Yes [provider]  losartan (COZAAR) 100 MG tablet Take 100 mg by mouth daily. 07/25/19  Yes [provider]  MAGNESIUM PO Take 1 tablet by mouth daily.   Yes [provider]  warfarin (COUMADIN) 5 MG tablet Take one tablet (5 mg) by mouth every day except Weds.  Take one and one-half tablet (7.5mg ) by mouth on Weds. 06/14/19  Yes [provider]    Physical Exam: Vitals:   09/01/23 0630 09/01/23 0645 09/01/23 0700 09/01/23 0715  BP: 93/64 114/68 100/61 99/62  Pulse: 72 76 75 73  Resp: 19 16 20 18   Temp:      TempSrc:      SpO2: 99% 100% 100% 100%   Constitutional: Elderly male in no acute distress.  Dried melanotic blood present on patient's shirt and pants Eyes: PERRL, lids and conjunctivae normal.  Laceration above the right brow ENMT: Mucous membranes are dry. Normal dentition.  Neck: normal, supple  Respiratory: clear to auscultation bilaterally, no wheezing, no crackles. Normal respiratory effort. No accessory muscle use.  Cardiovascular: Regular rate and rhythm, no murmurs / rubs / gallops. No extremity edema. Abdomen: Protuberant abdomen, no tenderness to palpation.  Bowel sounds present in all 4 quadrants. Musculoskeletal: no clubbing / cyanosis. No joint deformity upper and lower extremities. Good ROM, no contractures. Normal muscle tone.  Skin: no rashes, lesions, ulcers.  Neurologic: CN 2-12 grossly intact.   Strength 5/5 in all 4.  Psychiatric: Normal judgment and insight. Alert and oriented x 3. Normal mood.   Data Reviewed:  EKG reveals sinus rhythm at 70 bpm with T wave.  Reviewed labs, imaging, and  pertinent records as documented.  Assessment and Plan:  Acute blood loss anemia secondary to suspected upper GI bleed Patient presenting after having a fall at home noted to have incontinence of bloody stool that started yesterday.  Hemoglobin initially noted to be 7.9 with INR elevated at 4.5.  Patient did report use of cold and flu medicine that possibly had ibuprofen in it.  Remote records note hemoglobin previously had been within normal limits.  Stools are reported to be black in appearance.  Also noted to have significantly elevated BUN to creatinine ratio to suggest upper GI bleed as the source.  Patient has been typed and screened and ordered 2 units of packed red blood cells. - Admit to the progressive bed - N.p.o. for possible need of procedure - Serial monitoring of H&H.  Transfuse blood products as needed and maintain hemoglobin greater than 7-8 g/dL - Protonix 80 mg IV, then 40 mg IV twice daily - [facility] consulted,  follow-up for any further recommendations  Fall, at home Hypotension Acute.  Patient reported feeling dizzy prior to having a fall with possible transient loss of consciousness yesterday evening.  Blood pressures with EMS noted to be as low as 79/48.  Patient had been given at least 2 L of IV fluids and transfused 2 units of PRBCs with some improvement in blood pressures.  Home blood pressure regimen includes amlodipine and losartan. - Goal MAP greater than 65 - Hold home blood pressure regimen - IV fluids.  Will continue to monitor and adjust as needed  Supratherapeutic INR History of DVT and pulmonary embolism Acute.  INR noted to be elevated at 4.5. Patient reports having a remote history of having a right leg DVT and bilateral pulmonary emboli for which records show was in 2011.  He has not had any subsequent episodes and has been continued on anticoagulation.  Reports repeat evaluation of the lower extremities and chest had been done which showed resolution of  previous clots.  Last dose of Coumadin was yesterday morning. - Hold Coumadin - Goal INR less than 2 for endoscopic evaluation.  Vitamin K 2.5 mg  IV given x 1 dose - Recheck INR  Hyperkalemia Acute.  Potassium noted to be elevated at 5.7.  Patient has been given IV fluids. - Continue IV fluids - Recheck potassium levels  Acute kidney injury imposed on chronic kidney disease stage III Creatinine noted to be elevated up to 2.07 with BUN 77.  Suspect prerenal cause of symptoms given BUN to creatinine ratio greater than 20. Creatine 1.4-1.5  Urinalysis noted moderate hemoglobin with 0-5 RBCs, but is negative for any signs for infection. - Check CK - Normal saline IV fluids at 75 mL/h - Continue to monitor kidney function  Upper respiratory infection Prior to arrival.  Patient reports having a 10-day history of having a productive cough with fever, generalized myalgias, and malaise.  Recently treated with with 4 days of azithromycin - Check respiratory virus panel - Check procalcitonin - Check chest x-ray - Azithromycin 500 mg p.o. x 1 dose complete 5-day course  - Continue supportive care   DVT prophylaxis: SCDs Advance Care Planning:   Code Status: Full Code   Consults: [facility] Family Communication: Attempted to update wife over the phone  Severity of Illness: The appropriate patient status for this patient is INPATIENT. Inpatient status is judged to be reasonable and necessary in order to provide the required intensity of service to ensure the patient's safety. The patient's presenting symptoms, physical exam findings, and initial radiographic and laboratory data in the context of their chronic comorbidities is felt to place them at high risk for further clinical deterioration. Furthermore, it is not anticipated that the patient will be medically stable for discharge from the hospital within 2 midnights of admission.   * I certify that at the point of admission it is my clinical  judgment that the patient will require inpatient hospital care spanning beyond 2 midnights from the point of admission due to high intensity of service, high risk for further deterioration and high frequency of surveillance required.*  Author: Lena Qualia, MD 09/01/2023 7:41 AM  For on call review [email or web address].

## 2023-09-01 NOTE — ED Notes (Signed)
 Per NT, pt had large black/bloody stool in bedpan. Dr. Felipe Horton made aware.

## 2023-09-01 NOTE — ED Notes (Signed)
 Date and time results received: 09/01/23 0315 (use smartphrase ".now" to insert current time)  Test: INR Critical Value: 4.5  Name of Provider Notified: Noretta Bears, MD

## 2023-09-01 NOTE — Progress Notes (Signed)
 Orthopedic Tech Progress Note Patient Details:  Hunter Sandoval 12-29-1950 [id number]  Patient ID: [name], male   DOB: Jul 09, 1950, 73 y.o.   MRN: [id number] Checked in for level 2 trauma. Rayna Calkin 09/01/2023, 1:50 AM

## 2023-09-01 NOTE — ED Notes (Signed)
 Pt had large loose black bm. Pt cleaned and new linens changed.

## 2023-09-01 NOTE — ED Triage Notes (Signed)
 Pt is coming from home, pt has been weak for around a week has been Abx for a week. He went to get up to the bathroom and ended up falling and hitting his head. Pt had no LOC, had some Dizziness. Pt is otherwise pleasant at this time.    18g left forearm.    Medic vitals  80/40 68hr 99%ra 18rr 191bgl  NS

## 2023-09-01 NOTE — ED Notes (Signed)
 GI at bedside

## 2023-09-02 ENCOUNTER — Encounter (HOSPITAL_COMMUNITY): Payer: Self-pay | Admitting: Internal Medicine

## 2023-09-02 ENCOUNTER — Inpatient Hospital Stay (HOSPITAL_COMMUNITY): Admitting: Anesthesiology

## 2023-09-02 ENCOUNTER — Telehealth (HOSPITAL_COMMUNITY): Payer: Self-pay | Admitting: Pharmacy Technician

## 2023-09-02 ENCOUNTER — Inpatient Hospital Stay (HOSPITAL_COMMUNITY)

## 2023-09-02 ENCOUNTER — Other Ambulatory Visit (HOSPITAL_COMMUNITY): Payer: Self-pay

## 2023-09-02 ENCOUNTER — Encounter (HOSPITAL_COMMUNITY)
Admission: EM | Disposition: A | Discharge status: Home / Self Care | Payer: Self-pay | Source: Home / Self Care | Attending: Internal Medicine

## 2023-09-02 DIAGNOSIS — K2100 Gastro-esophageal reflux disease with esophagitis, without bleeding: Secondary | ICD-10-CM

## 2023-09-02 DIAGNOSIS — E039 Hypothyroidism, unspecified: Secondary | ICD-10-CM

## 2023-09-02 DIAGNOSIS — K2970 Gastritis, unspecified, without bleeding: Secondary | ICD-10-CM | POA: Diagnosis not present

## 2023-09-02 DIAGNOSIS — K259 Gastric ulcer, unspecified as acute or chronic, without hemorrhage or perforation: Secondary | ICD-10-CM

## 2023-09-02 DIAGNOSIS — K921 Melena: Secondary | ICD-10-CM

## 2023-09-02 DIAGNOSIS — D62 Acute posthemorrhagic anemia: Secondary | ICD-10-CM | POA: Diagnosis not present

## 2023-09-02 DIAGNOSIS — K922 Gastrointestinal hemorrhage, unspecified: Secondary | ICD-10-CM | POA: Diagnosis not present

## 2023-09-02 DIAGNOSIS — K254 Chronic or unspecified gastric ulcer with hemorrhage: Secondary | ICD-10-CM

## 2023-09-02 HISTORY — PX: ESOPHAGOGASTRODUODENOSCOPY: SHX5428

## 2023-09-02 LAB — CBC
HCT: 24.5 % — ABNORMAL LOW (ref 39.0–52.0)
HCT: 27.9 % — ABNORMAL LOW (ref 39.0–52.0)
HCT: 25.2 % — ABNORMAL LOW (ref 39.0–52.0)
Hemoglobin: 8.4 g/dL — ABNORMAL LOW (ref 13.0–17.0)
Hemoglobin: 9.4 g/dL — ABNORMAL LOW (ref 13.0–17.0)
Hemoglobin: 8.5 g/dL — ABNORMAL LOW (ref 13.0–17.0)
MCH: 29.1 pg (ref 26.0–34.0)
MCH: 28.7 pg (ref 26.0–34.0)
MCH: 29.3 pg (ref 26.0–34.0)
MCHC: 34.3 g/dL (ref 30.0–36.0)
MCHC: 33.7 g/dL (ref 30.0–36.0)
MCHC: 33.7 g/dL (ref 30.0–36.0)
MCV: 84.8 fL (ref 80.0–100.0)
MCV: 85.1 fL (ref 80.0–100.0)
MCV: 86.9 fL (ref 80.0–100.0)
Platelets: 230 K/uL (ref 150–400)
Platelets: 302 10*3/uL (ref 150–400)
Platelets: 287 10*3/uL (ref 150–400)
RBC: 2.89 MIL/uL — ABNORMAL LOW (ref 4.22–5.81)
RBC: 3.28 MIL/uL — ABNORMAL LOW (ref 4.22–5.81)
RBC: 2.9 MIL/uL — ABNORMAL LOW (ref 4.22–5.81)
RDW: 14.6 % (ref 11.5–15.5)
RDW: 14.6 % (ref 11.5–15.5)
RDW: 14.6 % (ref 11.5–15.5)
WBC: 10.1 K/uL (ref 4.0–10.5)
WBC: 13.2 10*3/uL — ABNORMAL HIGH (ref 4.0–10.5)
WBC: 13.2 10*3/uL — ABNORMAL HIGH (ref 4.0–10.5)
nRBC: 0 % (ref 0.0–0.2)
nRBC: 0 % (ref 0.0–0.2)
nRBC: 0 % (ref 0.0–0.2)

## 2023-09-02 LAB — TYPE AND SCREEN
ABO/RH(D): A NEG
Antibody Screen: NEGATIVE
Unit division: 0
Unit division: 0

## 2023-09-02 LAB — BPAM RBC
Blood Product Expiration Date: 202506032359
Blood Product Expiration Date: 202506032359
ISSUE DATE / TIME: 202505060228
ISSUE DATE / TIME: 202505060228
Unit Type and Rh: 5100
Unit Type and Rh: 5100

## 2023-09-02 LAB — PROTIME-INR
INR: 1.6 — ABNORMAL HIGH (ref 0.8–1.2)
Prothrombin Time: 19.5 s — ABNORMAL HIGH (ref 11.4–15.2)

## 2023-09-02 LAB — CORTISOL: Cortisol, Plasma: 7.5 ug/dL

## 2023-09-02 LAB — BRAIN NATRIURETIC PEPTIDE: B Natriuretic Peptide: 25.3 pg/mL (ref 0.0–100.0)

## 2023-09-02 LAB — BASIC METABOLIC PANEL WITH GFR
Anion gap: 7 (ref 5–15)
BUN: 62 mg/dL — ABNORMAL HIGH (ref 8–23)
CO2: 18 mmol/L — ABNORMAL LOW (ref 22–32)
Calcium: 8 mg/dL — ABNORMAL LOW (ref 8.9–10.3)
Chloride: 114 mmol/L — ABNORMAL HIGH (ref 98–111)
Creatinine, Ser: 1.49 mg/dL — ABNORMAL HIGH (ref 0.61–1.24)
GFR, Estimated: 50 mL/min — ABNORMAL LOW (ref >60)
Glucose, Bld: 102 mg/dL — ABNORMAL HIGH (ref 70–99)
Potassium: 4.4 mmol/L (ref 3.5–5.1)
Sodium: 139 mmol/L (ref 135–145)

## 2023-09-02 LAB — TSH: TSH: 5.613 u[IU]/mL — ABNORMAL HIGH (ref 0.350–4.500)

## 2023-09-02 LAB — T4, FREE: Free T4: 1.05 ng/dL (ref 0.61–1.12)

## 2023-09-02 SURGERY — EGD (ESOPHAGOGASTRODUODENOSCOPY)
Anesthesia: Monitor Anesthesia Care | Laterality: Left

## 2023-09-02 MED ORDER — SODIUM CHLORIDE 0.9 % IV SOLN: INTRAVENOUS | Status: DC | PRN

## 2023-09-02 MED ORDER — SODIUM CHLORIDE (PF) 0.9 % IJ SOLN
PREFILLED_SYRINGE | INTRAMUSCULAR | Status: DC | PRN
  Administered 2023-09-02: 3 mL

## 2023-09-02 MED ORDER — LACTATED RINGERS IV SOLN: INTRAVENOUS | Status: AC

## 2023-09-02 MED ORDER — MIDODRINE HCL 5 MG PO TABS
5.0000 mg | ORAL_TABLET | Freq: Three times a day (TID) | ORAL | Status: DC
  Administered 2023-09-02 (×3): 5 mg via ORAL
  Filled 2023-09-02 (×3): qty 1

## 2023-09-02 MED ORDER — SUCRALFATE 1 GM/10ML PO SUSP
1.0000 g | Freq: Three times a day (TID) | ORAL | Status: DC
  Administered 2023-09-02 – 2023-09-04 (×6): 1 g via ORAL
  Filled 2023-09-02 (×6): qty 10

## 2023-09-02 MED ORDER — PHENYLEPHRINE 80 MCG/ML (10ML) SYRINGE FOR IV PUSH (FOR BLOOD PRESSURE SUPPORT)
PREFILLED_SYRINGE | INTRAVENOUS | Status: DC | PRN
  Administered 2023-09-02 (×2): 80 ug via INTRAVENOUS

## 2023-09-02 MED ORDER — PROPOFOL 500 MG/50ML IV EMUL
INTRAVENOUS | Status: DC | PRN
  Administered 2023-09-02 (×2): 30 mg via INTRAVENOUS
  Administered 2023-09-02: 100 ug/kg/min via INTRAVENOUS

## 2023-09-02 MED ORDER — LIDOCAINE 2% (20 MG/ML) 5 ML SYRINGE
INTRAMUSCULAR | Status: DC | PRN
  Administered 2023-09-02: 50 mg via INTRAVENOUS

## 2023-09-02 NOTE — Evaluation (Signed)
 Occupational Therapy Evaluation Patient Details Name: Hunter Sandoval MRN: [id number] DOB: [birth year] Today's Date: 09/02/2023   History of Present Illness   73 y.o. male admitted 09/01/23 with lightheadedness, fall hitting R-side head with suspected LOC; bowel incontinence, hypotension en route with EMS. Workup for suspected GIB, hypotension. EGD 5/7. PMH includes HTN, HLD, DVT/PE on anticoagulation, CKD, BPH, obesity.     Clinical Impressions PTA, pt lived with wife and reports being independent. Upon eval, pt with decreased balance, activity tolerance, and cognition. Pt needing up to supervision for functional mobility and min A for LB ADL. Pt with decr awareness of lines and needing intermittent assist for balance throughout session. Occasionally uses humor to mask cognitive deficits. Will continue to follow but do not suspect need for follow up OT after dc.      If plan is discharge home, recommend the following:   A little help with walking and/or transfers;A little help with bathing/dressing/bathroom;Help with stairs or ramp for entrance;Assist for transportation     Functional Status Assessment   Patient has had a recent decline in their functional status and demonstrates the ability to make significant improvements in function in a reasonable and predictable amount of time.     Equipment Recommendations   None recommended by OT     Recommendations for Other Services         Precautions/Restrictions   Precautions Precautions: Fall;Other (comment) Recall of Precautions/Restrictions: Intact Precaution/Restrictions Comments: watch BP Restrictions Weight Bearing Restrictions Per Provider Order: No     Mobility Bed Mobility Overal bed mobility: Modified Independent                  Transfers Overall transfer level: Needs assistance Equipment used: None Transfers: Sit to/from Stand Sit to Stand: Supervision           General transfer comment:  standing from EOB and low toilet height without DME, supervision for safety/lines      Balance Overall balance assessment: Needs assistance Sitting-balance support: No upper extremity supported, Feet supported Sitting balance-Leahy Scale: Good     Standing balance support: No upper extremity supported, During functional activity Standing balance-Leahy Scale: Fair Standing balance comment: indep with pericare standing in bathroom                           ADL either performed or assessed with clinical judgement   ADL Overall ADL's : Needs assistance/impaired Eating/Feeding: Independent   Grooming: Supervision/safety;Standing   Upper Body Bathing: Independent   Lower Body Bathing: Supervison/ safety;Sit to/from stand   Upper Body Dressing : Modified independent   Lower Body Dressing: Minimal assistance;Sitting/lateral leans Lower Body Dressing Details (indicate cue type and reason): for socks Toilet Transfer: Supervision/safety;Ambulation   Toileting- Clothing Manipulation and Hygiene: Supervision/safety;Sit to/from stand       Functional mobility during ADLs: Supervision/safety General ADL Comments: pt walking one lap around hall     Vision         Perception         Praxis         Pertinent Vitals/Pain Pain Assessment Pain Assessment: Faces Faces Pain Scale: No hurt Pain Intervention(s): Limited activity within patient's tolerance, Monitored during session     Extremity/Trunk Assessment Upper Extremity Assessment Upper Extremity Assessment: Overall WFL for tasks assessed   Lower Extremity Assessment Lower Extremity Assessment: Defer to PT evaluation   Cervical / Trunk Assessment Cervical / Trunk Assessment: Normal   Communication  Communication Communication: No apparent difficulties   Cognition Arousal: Alert Behavior During Therapy: WFL for tasks assessed/performed Cognition: No family/caregiver present to determine baseline              OT - Cognition Comments: pt needing increased time for problem solving at times and cues for safety. Poor awareness of lines                 Following commands: Intact       Cueing  General Comments   Cueing Techniques: Verbal cues  wife present and supportive. educ re: role of acute PT, POC, activity recommendations, importance of OOB mobility with nursing staff, current BP values   Exercises     Shoulder Instructions      Home Living Family/patient expects to be discharged to:: Private residence Living Arrangements: Spouse/significant other Available Help at Discharge: Family;Available 24 hours/day Type of Home: House Home Access: Stairs to enter [employer] of Steps: 4-5 Entrance Stairs-Rails: Right Home Layout: Two level;Bed/bath upstairs;Able to live on main level with bedroom/bathroom   Alternate Level Stairs-Rails: Right Bathroom Shower/Tub: Producer, television/film/video: Standard Bathroom Accessibility: Yes   Home Equipment: Shower seat - built in   Additional Comments: lives with wife available for 24/7 assist      Prior Functioning/Environment Prior Level of Function : Independent/Modified Independent;Driving             Mobility Comments: indep without DME, retired from work, enjoys caring for four dogs, yard work. denies h/o falls or episodes of lightheadedness besides one leading to admission ADLs Comments: indep    OT Problem List: Decreased strength;Impaired balance (sitting and/or standing)   OT Treatment/Interventions: Self-care/ADL training;Therapeutic exercise;DME and/or AE instruction;Balance training;Patient/family education;Therapeutic activities      OT Goals(Current goals can be found in the care plan section)   Acute Rehab OT Goals Patient Stated Goal: go home OT Goal Formulation: With patient Time For Goal Achievement: 09/16/23 Potential to Achieve Goals: Good   OT Frequency:  Min 1X/week     Co-evaluation              AM-PAC OT "6 Clicks" Daily Activity     Outcome Measure Help from another person eating meals?: None Help from another person taking care of personal grooming?: A Little Help from another person toileting, which includes using toliet, bedpan, or urinal?: A Little Help from another person bathing (including washing, rinsing, drying)?: A Little Help from another person to put on and taking off regular upper body clothing?: None Help from another person to put on and taking off regular lower body clothing?: A Little 6 Click Score: 20   End of Session Equipment Utilized During Treatment: Gait belt Nurse Communication: Mobility status  Activity Tolerance: Patient tolerated treatment well Patient left: in bed;with call bell/phone within reach  OT Visit Diagnosis: Unsteadiness on feet (R26.81);History of falling (Z91.81);Other symptoms and signs involving cognitive function                Time: 1478-2956 OT Time Calculation (min): 20 min Charges:  OT General Charges $OT Visit: 1 Visit OT Evaluation $OT Eval Low Complexity: 1 Low  Karilyn Ouch, OTR/L [facility] Acute Rehabilitation Office: [phone]   [name] 09/02/2023, 4:54 PM

## 2023-09-02 NOTE — Anesthesia Preprocedure Evaluation (Addendum)
 Anesthesia Evaluation  Patient identified by MRN, date of birth, ID band Patient awake    Reviewed: Allergy & Precautions, NPO status , Patient's Chart, lab work & pertinent test results  Airway Mallampati: III  TM Distance: >3 FB Neck ROM: Full    Dental no notable dental hx. (+) Teeth Intact, Dental Advisory Given   Pulmonary Current Smoker, PE   Pulmonary exam normal breath sounds clear to auscultation       Cardiovascular hypertension, Pt. on medications + DVT  Normal cardiovascular exam Rhythm:Regular Rate:Normal     Neuro/Psych negative neurological ROS  negative psych ROS   GI/Hepatic negative GI ROS, Neg liver ROS,,,  Endo/Other  Hypothyroidism    Renal/GU Renal diseaseLab Results      Component                Value               Date                      NA                       139                 09/02/2023                CL                       114 (H)             09/02/2023                K                        4.4                 09/02/2023                CO2                      18 (L)              09/02/2023                BUN                      62 (H)              09/02/2023                CREATININE               1.49 (H)            09/02/2023                GFRNONAA                 50 (L)              09/02/2023                CALCIUM                  8.0 (L)             09/02/2023                PHOS  3.6                 10/24/2009                ALBUMIN                  2.0 (L)             09/01/2023                GLUCOSE                  102 (H)             09/02/2023             negative genitourinary   Musculoskeletal negative musculoskeletal ROS (+)    Abdominal   Peds  Hematology  (+) Blood dyscrasia (coumadin), anemia Lab Results      Component                Value               Date                      NA                       139                 09/02/2023                 CL                       114 (H)             09/02/2023                K                        4.4                 09/02/2023                CO2                      18 (L)              09/02/2023                BUN                      62 (H)              09/02/2023                CREATININE               1.49 (H)            09/02/2023                GFRNONAA                 50 (L)              09/02/2023                CALCIUM                  8.0 (L)  09/02/2023                PHOS                     3.6                 10/24/2009                ALBUMIN                  2.0 (L)             09/01/2023                GLUCOSE                  102 (H)             09/02/2023              Anesthesia Other Findings   Reproductive/Obstetrics                             Anesthesia Physical Anesthesia Plan  ASA: 3  Anesthesia Plan: MAC   Post-op Pain Management:    Induction: Intravenous  PONV Risk Score and Plan: Propofol infusion and Treatment may vary due to age or medical condition  Airway Management Planned: Natural Airway  Additional Equipment:   Intra-op Plan:   Post-operative Plan:   Informed Consent: I have reviewed the patients History and Physical, chart, labs and discussed the procedure including the risks, benefits and alternatives for the proposed anesthesia with the patient or authorized representative who has indicated his/her understanding and acceptance.     Dental advisory given  Plan Discussed with: CRNA  Anesthesia Plan Comments:        Anesthesia Quick Evaluation

## 2023-09-02 NOTE — Plan of Care (Deleted)
 Hunter Sandoval

## 2023-09-02 NOTE — Telephone Encounter (Signed)
 Patient Product/process development scientist completed.    The patient is insured through [facility]. Patient has Medicare and is not eligible for a copay card, but may be able to apply for patient assistance or Medicare RX Payment Plan (Patient Must reach out to their plan, if eligible for payment plan), if available.    Ran test claim for Eliquis 5 mg and the current 30 day co-pay is $302.00 due to a $255.00 deductible.  Will be $47.00 once deductible is met.  Ran test claim for Xarelto 20 mg and the current 30 day co-pay is $302.00 due to a $255.00 deductible.  Will be $47.00 once deductible is met.  This test claim was processed through [facility]- copay amounts may vary at other pharmacies due to pharmacy/plan contracts, or as the patient moves through the different stages of their insurance plan.     Roland Earl, CPHT Pharmacy Technician III Certified Patient Advocate [facility] Patient Advocate Team Direct Number: [phone]  Fax: [phone]

## 2023-09-02 NOTE — Progress Notes (Signed)
 PROGRESS NOTE                                                                                                                                                                                                             Patient Demographics:    Hunter Sandoval, is a 73 y.o. male, DOB - [birth year], ZHY:[id number]  Outpatient Primary MD for the patient is Pete Brand, DO    LOS - 1  Admit date - 09/01/2023    Chief Complaint  Patient presents with   FOT       Brief Narrative (HPI from H&P)    73 y.o. male with medical history significant of hypertension, hyperlipidemia, history of DVT/PE on anticoagulation, CKD stage III, hypothyroidism, BPH, and obesity presents with lightheadedness and a fall.   Reports a 10-day history of having a upper respiratory illness, including achy joints, fatigue, anorexia, and a productive cough. He began antibiotics and cough medicine on Thursday of last week.  His fever had reached nearly 103F before starting antibiotics, which subsequently reduced the fever. He has been taking cold and flu medicine that possibly has ibuprofen, likely two a day, for joint aches and pains other symptoms.   He experienced lightheadedness and fell last night while attempting to go to the bathroom.  Patient did hit the right side of his head and thinks that if he lost consciousness it was for brief second.  He had been feeling lightheaded prior to the fall and had difficulty returning to the bathroom on previous occasions. After the fall, he realized he had soiled himself and his wife called EMS. He reports diarrhea that began yesterday around 6 PM, noting that the stool was black.  In the ER his INR was found to be high and hemoglobin was low.  He was admitted for GI bleed, hypotension, supratherapeutic INR and fall.    Subjective:    Hunter Sandoval today has, No headache, No chest pain, No abdominal pain - No Nausea, No new  weakness tingling or numbness,  no further blood in the stool.   Assessment  & Plan :    Acute blood loss anemia secondary to suspected upper GI bleed, causing hypotension and fall with supratherapeutic INR.  Patient scented with melanotic stool, INR of 4.5, hemoglobin 7.9 and SBP in 80s, he recently had URI for which  she had used some NSAIDs and is also on Coumadin.  Overall picture suspicious for upper GI bleed with symptomatic acute blood loss anemia, he has received 2 units of packed RBCs, avoid further NSAIDs, continue IV PPI, GI on board, continue to monitor CBC.  INR thankfully has come down after vitamin K at home, we will continue to monitor closely both clinically and via CBC.  GI on board   Fall, at home Hypotension Hold blood pressure medications, monitor H&H, has been adequately hydrated and he has received 2 units of packed RBC, for now low-dose midodrine, PT OT and monitor.   Supratherapeutic INR - History of DVT and pulmonary embolism INR was reversed with vitamin K in the ER, INR stable, history of clots is remote, will also look into DOAC's upon discharge.   Acute kidney injury imposed on chronic kidney disease stage III with hyperkalemia Prerenal due to upper GI bleed and hypoperfusion, much improved, avoid nephrotoxins hold ARB and monitor.   Upper respiratory infection Prior to arrival.  Patient reports having a 10-day history of having a productive cough with fever, generalized myalgias, and malaise.  Recently treated with with 4 days of azithromycin -Negative respiratory viral panel -Stable procalcitonin -Stable chest x-ray - Azithromycin 500 mg p.o. x 1 dose complete 5-day course  - Continue supportive care      Condition - Fair  Family Communication  : None present  Code Status :  Full  Consults  :  GI  PUD Prophylaxis :     Procedures  :     CT head and C-spine.  Nonacute.      Disposition Plan  :    Status is: Inpatient  DVT Prophylaxis  :     Place and maintain sequential compression device Start: 09/01/23 0912    Lab Results  Component Value Date   PLT 230 09/02/2023    Diet :  Diet Order             Diet clear liquid Room service appropriate? Yes; Fluid consistency: Thin  Diet effective now                    Inpatient Medications  Scheduled Meds:  midodrine  5 mg Oral TID WC   pantoprazole (PROTONIX) IV  40 mg Intravenous Q12H   sodium chloride  flush  3 mL Intravenous Q12H   Continuous Infusions: PRN Meds:.acetaminophen **OR** acetaminophen, albuterol, ondansetron **OR** ondansetron (ZOFRAN) IV  Antibiotics  :    Anti-infectives (From admission, onward)    Start     Dose/Rate Route Frequency Ordered Stop   09/01/23 0930  azithromycin (ZITHROMAX) tablet 250 mg        250 mg Oral  Once 09/01/23 0922 09/01/23 1135         Objective:   Vitals:   09/01/23 2300 09/01/23 2353 09/02/23 0441 09/02/23 0822  BP:   (!) 104/55 128/71  Pulse:    67  Resp: 18   18  Temp:  98.2 F (36.8 C) 98 F (36.7 C) 98.3 F (36.8 C)  TempSrc:  Oral Axillary Oral  SpO2:    99%    Wt Readings from Last 3 Encounters:  09/07/19 96.2 kg  08/11/19 96.2 kg     Intake/Output Summary (Last 24 hours) at 09/02/2023 0911 Last data filed at 09/02/2023 0800 Gross per 24 hour  Intake 480 ml  Output 3150 ml  Net -2670 ml     Physical Exam  Awake  Alert, No new F.N deficits, Normal affect [place].AT,PERRAL Supple Neck, No JVD,   Symmetrical Chest wall movement, Good air movement bilaterally, CTAB RRR,No Gallops,Rubs or new Murmurs,  +ve B.Sounds, Abd Soft, No tenderness,   No Cyanosis, Clubbing or edema        Data Review:    Recent Labs  Lab 09/01/23 0201 09/01/23 1025 09/01/23 1652 09/01/23 2008 09/02/23 0443  WBC 10.3  --   --   --  10.1  HGB 7.9* 8.5* 9.0* 8.4* 8.4*  HCT 24.8* 25.1* 26.6* 25.2* 24.5*  PLT 291  --   --   --  230  MCV 92.2  --   --   --  84.8  MCH 29.4  --   --   --  29.1  MCHC 31.9   --   --   --  34.3  RDW 13.4  --   --   --  14.6    Recent Labs  Lab 09/01/23 0200 09/01/23 0201 09/01/23 1025 09/01/23 1652 09/02/23 0443  NA 138 137 139  --  139  K 5.7* 5.7* 3.9  --  4.4  CL 108 113* 117*  --  114*  CO2  --  17* 15*  --  18*  ANIONGAP  --  7 7  --  7  GLUCOSE 171* 182* 108*  --  102*  BUN 82* 77* 67*  --  62*  CREATININE 2.20* 2.07* 1.48*  --  1.49*  AST  --  32  --   --   --   ALT  --  41  --   --   --   ALKPHOS  --  43  --   --   --   BILITOT  --  0.5  --   --   --   ALBUMIN  --  2.0*  --   --   --   PROCALCITON  --   --  0.56  --   --   LATICACIDVEN  --  1.2  --   --   --   INR  --  4.5*  --  1.9* 1.6*  TSH  --   --   --   --  5.613*  BNP  --   --   --   --  25.3  CALCIUM  --  7.1* 6.1*  --  8.0*      Recent Labs  Lab 09/01/23 0201 09/01/23 1025 09/01/23 1652 09/02/23 0443  PROCALCITON  --  0.56  --   --   LATICACIDVEN 1.2  --   --   --   INR 4.5*  --  1.9* 1.6*  TSH  --   --   --  5.613*  BNP  --   --   --  25.3  CALCIUM 7.1* 6.1*  --  8.0*    Lab Results  Component Value Date   CHOL 210 (HH) 11/22/2007   HDL 45.7 11/22/2007   LDLDIRECT 136.6 11/22/2007   TRIG 119 11/22/2007   CHOLHDL 4.6 CALC 11/22/2007    No results found for: "HGBA1C" Recent Labs    09/02/23 0443  TSH 5.613*  FREET4 1.05      Micro Results Recent Results (from the past 240 hours)  Resp panel by RT-PCR (RSV, Flu A&B, Covid) Anterior Nasal Swab     Status: None   Collection Time: 09/01/23  8:33 AM   Specimen: Anterior Nasal Swab  Result Value Ref Range  Status   SARS Coronavirus 2 by RT PCR NEGATIVE NEGATIVE Final   Influenza A by PCR NEGATIVE NEGATIVE Final   Influenza B by PCR NEGATIVE NEGATIVE Final    Comment: (NOTE) The Xpert Xpress SARS-CoV-2/FLU/RSV plus assay is intended as an aid in the diagnosis of influenza from Nasopharyngeal swab specimens and should not be used as a sole basis for treatment. Nasal washings and aspirates are  unacceptable for Xpert Xpress SARS-CoV-2/FLU/RSV testing.  Fact Sheet for Patients: BloggerCourse.com  Fact Sheet for Healthcare Providers: SeriousBroker.it  This test is not yet approved or cleared by the [place]  FDA and has been authorized for detection and/or diagnosis of SARS-CoV-2 by FDA under an Emergency Use Authorization (EUA). This EUA will remain in effect (meaning this test can be used) for the duration of the COVID-19 declaration under Section 564(b)(1) of the Act, 21 U.S.C. section 360bbb-3(b)(1), unless the authorization is terminated or revoked.     Resp Syncytial Virus by PCR NEGATIVE NEGATIVE Final    Comment: (NOTE) Fact Sheet for Patients: BloggerCourse.com  Fact Sheet for Healthcare Providers: SeriousBroker.it  This test is not yet approved or cleared by the [place]  FDA and has been authorized for detection and/or diagnosis of SARS-CoV-2 by FDA under an Emergency Use Authorization (EUA). This EUA will remain in effect (meaning this test can be used) for the duration of the COVID-19 declaration under Section 564(b)(1) of the Act, 21 U.S.C. section 360bbb-3(b)(1), unless the authorization is terminated or revoked.  Performed at [facility], [address] [place], [place] [address]     Radiology Report DG Chest Port 1 View Result Date: 09/02/2023 CLINICAL DATA:  Shortness of breath. EXAM: PORTABLE CHEST 1 VIEW COMPARISON:  09/01/2023 FINDINGS: Stable cardiomediastinal contours. Both lungs are clear. No pleural fluid, interstitial edema or airspace consolidation. The visualized osseous structures are unremarkable. IMPRESSION: No active disease. Electronically Signed   By: Kimberley Penman M.D.   On: 09/02/2023 06:41   DG CHEST PORT 1 VIEW Result Date: 09/01/2023 CLINICAL DATA:  Cough. EXAM: PORTABLE CHEST 1 VIEW COMPARISON:  October 25, 2009.  FINDINGS: Stable cardiomediastinal silhouette. Both lungs are clear. The visualized skeletal structures are unremarkable. IMPRESSION: No active disease. Electronically Signed   By: Rosalene Colon M.D.   On: 09/01/2023 09:45   CT HEAD WO CONTRAST Result Date: 09/01/2023 CLINICAL DATA:  Blunt polytrauma, moderate to severe head trauma. Fell and hit head. Dizziness. EXAM: CT HEAD WITHOUT CONTRAST CT CERVICAL SPINE WITHOUT CONTRAST TECHNIQUE: Multidetector CT imaging of the head and cervical spine was performed following the standard protocol without intravenous contrast. Multiplanar CT image reconstructions of the cervical spine were also generated. RADIATION DOSE REDUCTION: This exam was performed according to the departmental dose-optimization program which includes automated exposure control, adjustment of the mA and/or kV according to patient size and/or use of iterative reconstruction technique. COMPARISON:  None Available. FINDINGS: CT HEAD FINDINGS Brain: No intracranial hemorrhage, mass effect, or evidence of acute infarct. No hydrocephalus. No extra-axial fluid collection. Age related cerebral atrophy and chronic small vessel ischemic disease. Vascular: No hyperdense vessel. Intracranial arterial calcification. Skull: No fracture or focal lesion. Sinuses/Orbits: No acute finding. Other: None. CT CERVICAL SPINE FINDINGS Alignment: No evidence of traumatic malalignment. Skull base and vertebrae: No acute fracture. No primary bone lesion or focal pathologic process. Soft tissues and spinal canal: No prevertebral fluid or swelling. No visible canal hematoma. Disc levels: Multilevel spondylosis, disc space height loss, and degenerative endplate changes greatest  at C3-C4, C4-C5, and C5-C6 where it is moderate. Posterior disc osteophyte complexes cause multilevel effacement of the ventral thecal sac greatest at C3-C4 where it is mild-to-moderate. Upper chest: No acute abnormality. Other: Carotid calcification.  IMPRESSION: 1. No acute intracranial abnormality. 2. No acute fracture in the cervical spine. Electronically Signed   By: Rozell Cornet M.D.   On: 09/01/2023 02:25   CT CERVICAL SPINE WO CONTRAST Result Date: 09/01/2023 CLINICAL DATA:  Blunt polytrauma, moderate to severe head trauma. Fell and hit head. Dizziness. EXAM: CT HEAD WITHOUT CONTRAST CT CERVICAL SPINE WITHOUT CONTRAST TECHNIQUE: Multidetector CT imaging of the head and cervical spine was performed following the standard protocol without intravenous contrast. Multiplanar CT image reconstructions of the cervical spine were also generated. RADIATION DOSE REDUCTION: This exam was performed according to the departmental dose-optimization program which includes automated exposure control, adjustment of the mA and/or kV according to patient size and/or use of iterative reconstruction technique. COMPARISON:  None Available. FINDINGS: CT HEAD FINDINGS Brain: No intracranial hemorrhage, mass effect, or evidence of acute infarct. No hydrocephalus. No extra-axial fluid collection. Age related cerebral atrophy and chronic small vessel ischemic disease. Vascular: No hyperdense vessel. Intracranial arterial calcification. Skull: No fracture or focal lesion. Sinuses/Orbits: No acute finding. Other: None. CT CERVICAL SPINE FINDINGS Alignment: No evidence of traumatic malalignment. Skull base and vertebrae: No acute fracture. No primary bone lesion or focal pathologic process. Soft tissues and spinal canal: No prevertebral fluid or swelling. No visible canal hematoma. Disc levels: Multilevel spondylosis, disc space height loss, and degenerative endplate changes greatest at C3-C4, C4-C5, and C5-C6 where it is moderate. Posterior disc osteophyte complexes cause multilevel effacement of the ventral thecal sac greatest at C3-C4 where it is mild-to-moderate. Upper chest: No acute abnormality. Other: Carotid calcification. IMPRESSION: 1. No acute intracranial abnormality. 2.  No acute fracture in the cervical spine. Electronically Signed   By: Rozell Cornet M.D.   On: 09/01/2023 02:25     Signature  -   Lynnwood Sauer M.D on 09/02/2023 at 9:11 AM   -  To page go to [email or web address]

## 2023-09-02 NOTE — Evaluation (Addendum)
 Physical Therapy Evaluation Patient Details Name: Hunter Sandoval MRN: [id number] DOB: 1950/09/16 Today's Date: 09/02/2023  History of Present Illness  73 y.o. male admitted 09/01/23 with lightheadedness, fall hitting R-side head with suspected LOC; bowel incontinence, hypotension en route with EMS. Workup for suspected GIB, hypotension. Plan for EGD 5/7. PMH includes HTN, HLD, DVT/PE on anticoagulation, CKD, BPH, obesity.   Clinical Impression  Pt presents with an overall decrease in functional mobility secondary to above. PTA, pt independent without DME, retired from work, active around [employer], lives with supportive wife. Today, pt able to transfer and ambulate in room, supervision-CGA for safety/lines; pt denies lightheadedness, but does endorse feeling weak and fatigued. Initiated educ re: precautions, positioning, therex, and importance of mobility. Expect pt to progress well with mobility once GI and hypotension resolved. Will follow acutely to address established goals.  Orthostatic BPs Sitting 137/76 (MAP 53)  Standing 114/75 (MAP 88)  Standing after 2-min 114/76 (MAP 89)  Standing post-ambulation to bathroom 109/79 (89)   HR 70s-100s    If plan is discharge home, recommend the following: Assistance with cooking/housework;Assist for transportation   Can travel by private vehicle    Yes    Equipment Recommendations None recommended by PT  Recommendations for Other Services       Functional Status Assessment Patient has had a recent decline in their functional status and demonstrates the ability to make significant improvements in function in a reasonable and predictable amount of time.     Precautions / Restrictions Precautions Precautions: Fall;Other (comment) Recall of Precautions/Restrictions: Intact Precaution/Restrictions Comments: watch BP Restrictions Weight Bearing Restrictions Per Provider Order: No      Mobility  Bed Mobility Overal bed mobility: Modified  Independent                  Transfers Overall transfer level: Needs assistance Equipment used: None Transfers: Sit to/from Stand Sit to Stand: Supervision           General transfer comment: standing from EOB and low toilet height without DME, supervision for safety/lines    Ambulation/Gait Ambulation/Gait assistance: Contact guard assist Gait Distance (Feet): 32 Feet Assistive device: None Gait Pattern/deviations: Step-through pattern, Decreased stride length Gait velocity: Decreased     General Gait Details: slow, fairly steady gait without DME, initially reaching for UE support on sink; walking to/from bathroom, CGA for balance/lines; pt denies lightheadedness but endorses feeling weak/fatigued  Stairs            Wheelchair Mobility     Tilt Bed    Modified Rankin (Stroke Patients Only)       Balance Overall balance assessment: Needs assistance Sitting-balance support: No upper extremity supported, Feet supported Sitting balance-Leahy Scale: Good     Standing balance support: No upper extremity supported, During functional activity Standing balance-Leahy Scale: Fair Standing balance comment: indep with pericare standing in bathroom                             Pertinent Vitals/Pain Pain Assessment Pain Assessment: Faces Faces Pain Scale: No hurt Pain Intervention(s): Monitored during session    Home Living Family/patient expects to be discharged to:: Private residence Living Arrangements: Spouse/significant other Available Help at Discharge: Family;Available 24 hours/day Type of Home: House Home Access: Stairs to enter Entrance Stairs-Rails: Right Entrance Stairs-Number of Steps: 4-5   Home Layout: Two level;Bed/bath upstairs;Able to live on main level with bedroom/bathroom Home Equipment: Shower seat -  built in Additional Comments: lives with wife available for 24/7 assist    Prior Function Prior Level of Function :  Independent/Modified Independent;Driving             Mobility Comments: indep without DME, retired from work, enjoys caring for four dogs, yard work. denies h/o falls or episodes of lightheadedness besides one leading to admission ADLs Comments: indep     Extremity/Trunk Assessment   Upper Extremity Assessment Upper Extremity Assessment: Overall WFL for tasks assessed    Lower Extremity Assessment Lower Extremity Assessment: Overall WFL for tasks assessed    Cervical / Trunk Assessment Cervical / Trunk Assessment: Normal  Communication   Communication Communication: No apparent difficulties    Cognition Arousal: Alert Behavior During Therapy: WFL for tasks assessed/performed   PT - Cognitive impairments: No apparent impairments                         Following commands: Intact       Cueing       General Comments General comments (skin integrity, edema, etc.): wife present and supportive. educ re: role of acute PT, POC, activity recommendations, importance of OOB mobility with nursing staff, current BP values    Exercises     Assessment/Plan    PT Assessment Patient needs continued PT services  PT Problem List Decreased activity tolerance;Decreased balance;Decreased mobility;Cardiopulmonary status limiting activity       PT Treatment Interventions DME instruction;Gait training;Stair training;Functional mobility training;Therapeutic activities;Therapeutic exercise;Balance training;Patient/family education    PT Goals (Current goals can be found in the Care Plan section)  Acute Rehab PT Goals Patient Stated Goal: feel better, return home PT Goal Formulation: With patient/family Time For Goal Achievement: 09/16/23 Potential to Achieve Goals: Good    Frequency Min 1X/week     Co-evaluation               AM-PAC PT "6 Clicks" Mobility  Outcome Measure Help needed turning from your back to your side while in a flat bed without using  bedrails?: None Help needed moving from lying on your back to sitting on the side of a flat bed without using bedrails?: None Help needed moving to and from a bed to a chair (including a wheelchair)?: A Little Help needed standing up from a chair using your arms (e.g., wheelchair or bedside chair)?: A Little Help needed to walk in hospital room?: A Little Help needed climbing 3-5 steps with a railing? : A Little 6 Click Score: 20    End of Session   Activity Tolerance: Patient tolerated treatment well Patient left: with call bell/phone within reach;in bed;with family/visitor present Nurse Communication: Mobility status;Other (comment) (RN states ok with bed alarm off; pt and wife know to call for assist) PT Visit Diagnosis: Other abnormalities of gait and mobility (R26.89)    Time: 1610-9604 PT Time Calculation (min) (ACUTE ONLY): 24 min   Charges:   PT Evaluation $PT Eval Low Complexity: 1 Low PT Treatments $Therapeutic Activity: 8-22 mins PT General Charges $$ ACUTE PT VISIT: 1 Visit       Blase Bur, PT, DPT Acute Rehabilitation Services  Personal: Secure Chat Rehab Office: [phone]  Albino Hum 09/02/2023, 3:08 PM

## 2023-09-02 NOTE — Op Note (Signed)
 [facility] Patient Name: Hunter Sandoval Procedure Date : 09/02/2023 MRN: [id number] Attending MD: Isaias Lindau , MD, [id number] Date of Birth: [birth year] CSN: [id number] Age: 73 Admit Type: Inpatient Procedure:                Upper GI endoscopy Indications:              Acute post hemorrhagic anemia, Melena, Symptomatic                            anemia Providers:                Cherry Lindau, MD, Bradley Caffey, Marvelyn Slim, Technician Referring MD:              Medicines:                Monitored Anesthesia Care Complications:            No immediate complications. Estimated Blood Loss:     Estimated blood loss was minimal. Procedure:                Pre-Anesthesia Assessment:                           - Prior to the procedure, a History and Physical                            was performed, and patient medications and                            allergies were reviewed. The patient's tolerance of                            previous anesthesia was also reviewed. The risks                            and benefits of the procedure and the sedation                            options and risks were discussed with the patient.                            All questions were answered, and informed consent                            was obtained. Prior Anticoagulants: The patient has                            taken Coumadin (warfarin), last dose was 2 days                            prior to procedure. ASA Grade Assessment: III - A  patient with severe systemic disease. After                            reviewing the risks and benefits, the patient was                            deemed in satisfactory condition to undergo the                            procedure.                           After obtaining informed consent, the endoscope was                            passed under direct vision. Throughout the                             procedure, the patient's blood pressure, pulse, and                            oxygen saturations were monitored continuously. The                            [id number]-H190 ([id number]) Olympus endoscope was introduced                            through the mouth, and advanced to the third part                            of duodenum. The upper GI endoscopy was                            accomplished without difficulty. The patient                            tolerated the procedure well. Scope In: Scope Out: Findings:      LA Grade B (one or more mucosal breaks greater than 5 mm, not extending       between the tops of two mucosal folds) esophagitis with no bleeding was       found in the lower third of the esophagus.      The upper third of the esophagus and middle third of the esophagus were       normal.      Moderate inflammation characterized by congestion (edema), erosions,       erythema, and shallow ulcerations was found in the gastric body, at the       incisura and in the gastric antrum. Biopsies were taken with a cold       forceps for Helicobacter pylori testing. Estimated blood loss was       minimal.      One non-bleeding cratered gastric ulcer with pigmented material was       found in the gastric fundus. The lesion was 8 mm in largest dimension.       For hemostasis, three hemostatic clips were successfully  placed (MR       conditional). Clip manufacturer: [employer]. Area       was then successfully injected with 3 mL of a 0.1 mg/mL solution of       epinephrine for dual hemostasis with appropriate mucosal blanching.       Estimated blood loss was minimal.      The examined duodenum was normal. Impression:               - LA Grade B reflux esophagitis with no bleeding.                           - Normal upper third of esophagus and middle third                            of esophagus.                           - Gastritis. Biopsied.                            - Non-bleeding gastric ulcer with pigmented                            material. Clips (MR conditional) were placed. Clip                            manufacturer: [employer]. Injected.                           - Normal examined duodenum. Recommendation:           - Return patient to hospital ward for ongoing care.                           - Full liquid diet today.                           - Resume Protonix (pantoprazole) 40 mg IV BID.                           - Use sucralfate suspension 1 gram PO BID.                           - Await pathology results.                           - Continue holding Eliquis.                           - Continue serial CBC checks with blood products as                            needed per protocol.                           - Repeat upper endoscopy in 6-8 weeks to check  healing. Procedure Code(s):        --- Professional ---                           [phone], 59, Esophagogastroduodenoscopy, flexible,                            transoral; with control of bleeding, any method                           43239, Esophagogastroduodenoscopy, flexible,                            transoral; with biopsy, single or multiple Diagnosis Code(s):        --- Professional ---                           K21.00, Gastro-esophageal reflux disease with                            esophagitis, without bleeding                           K29.70, Gastritis, unspecified, without bleeding                           K25.9, Gastric ulcer, unspecified as acute or                            chronic, without hemorrhage or perforation                           D62, Acute posthemorrhagic anemia                           K92.1, Melena (includes Hematochezia) CPT copyright 2022 American Medical Association. All rights reserved. The codes documented in this report are preliminary and upon coder review may  be revised to meet current compliance  requirements. Rakan Lindau, MD 09/02/2023 3:11:48 PM Number of Addenda: 0

## 2023-09-02 NOTE — Anesthesia Postprocedure Evaluation (Signed)
 Anesthesia Post Note  Patient: Hunter Sandoval  Procedure(s) Performed: EGD (ESOPHAGOGASTRODUODENOSCOPY) (Left)     Patient location during evaluation: Endoscopy Anesthesia Type: MAC Level of consciousness: awake and alert Pain management: pain level controlled Vital Signs Assessment: post-procedure vital signs reviewed and stable Respiratory status: spontaneous breathing, nonlabored ventilation, respiratory function stable and patient connected to nasal cannula oxygen Cardiovascular status: stable and blood pressure returned to baseline Postop Assessment: no apparent nausea or vomiting Anesthetic complications: no  No notable events documented.  Last Vitals:  Vitals:   09/02/23 1520 09/02/23 1530  BP: 130/63 (!) 145/72  Pulse: 62 62  Resp: 19 17  Temp:    SpO2: 100% 99%    Last Pain:  Vitals:   09/02/23 1530  TempSrc:   PainSc: 0-No pain                 Gurjot Brisco L Richy Spradley

## 2023-09-02 NOTE — Transfer of Care (Signed)
 Immediate Anesthesia Transfer of Care Note  Patient: Hunter Sandoval  Procedure(s) Performed: EGD (ESOPHAGOGASTRODUODENOSCOPY) (Left)  Patient Location: PACU  Anesthesia Type:MAC  Level of Consciousness: drowsy  Airway & Oxygen Therapy: Patient Spontanous Breathing  Post-op Assessment: Report given to RN and Post -op Vital signs reviewed and stable  Post vital signs: Reviewed and stable  Last Vitals:  Vitals Value Taken Time  BP 113/59   Temp    Pulse 67   Resp 23   SpO2 100     Last Pain:  Vitals:   09/02/23 1330  TempSrc: Temporal  PainSc: 0-No pain         Complications: No notable events documented.

## 2023-09-02 NOTE — Progress Notes (Signed)
 OT Cancellation Note  Patient Details Name: Hunter Sandoval MRN: [id number] DOB: Sep 26, 1950   Cancelled Treatment:    Reason Eval/Treat Not Completed: Patient at procedure or test/ unavailable. Off floor for endoscopy. Will follow up as schedule allows.   Karilyn Ouch, OTR/L [facility] Office: [phone]   [name] 09/02/2023, 1:33 PM

## 2023-09-02 NOTE — H&P (View-Only) (Signed)
 Patient ID: Hunter Sandoval, male   DOB: December 04, 1950, 73 y.o.   MRN: [id number]     Attending physician's note   I have taken a history, reviewed the chart, and examined the patient. I performed a substantive portion of this encounter, including complete performance of at least one of the key components, in conjunction with the APP. I agree with the APP's note, impression, and recommendations with my edits.   Abiha Lukehart, DO, FACG [phone] office         Progress Note   Subjective   Day # 2 CC; acute GI bleed in setting of Coumadin with supratherapeutic INR  Patient says he feels a little better today than he did yesterday no complaints of abdominal pain, he did have at least 2 black bowel movements overnight  Labs-pro time 19.5/INR 1.6 BUN 62/creatinine 1.49 Hemoglobin 4 AM 8.4/hematocrit 24.5 stable   Objective   Vital signs in last 24 hours: Temp:  [98 F (36.7 C)-98.9 F (37.2 C)] 98.3 F (36.8 C) (05/07 0822) Pulse Rate:  [67-82] 67 (05/07 0822) Resp:  [15-23] 18 (05/07 0822) BP: (104-136)/(55-77) 128/71 (05/07 0822) SpO2:  [98 %-100 %] 99 % (05/07 0822) Last BM Date : 09/02/23 General:    Older white male in NAD Heart:  Regular rate and rhythm; no murmurs Lungs: Respirations even and unlabored, lungs CTA bilaterally Abdomen:  Soft, nontender and nondistended. Normal bowel sounds. Extremities:  Without edema. Neurologic:  Alert and oriented,  grossly normal neurologically. Psych:  Cooperative. Normal mood and affect.  Intake/Output from previous day: 05/06 0701 - 05/07 0700 In: 480 [P.O.:480] Out: 2850 [Urine:2850] Intake/Output this shift: Total I/O In: -  Out: 300 [Urine:300]  Lab Results: Recent Labs    09/01/23 0201 09/01/23 1025 09/01/23 1652 09/01/23 2008 09/02/23 0443  WBC 10.3  --   --   --  10.1  HGB 7.9*   < > 9.0* 8.4* 8.4*  HCT 24.8*   < > 26.6* 25.2* 24.5*  PLT 291  --   --   --  230   < > = values in this interval not  displayed.   BMET Recent Labs    09/01/23 0201 09/01/23 1025 09/02/23 0443  NA 137 139 139  K 5.7* 3.9 4.4  CL 113* 117* 114*  CO2 17* 15* 18*  GLUCOSE 182* 108* 102*  BUN 77* 67* 62*  CREATININE 2.07* 1.48* 1.49*  CALCIUM 7.1* 6.1* 8.0*   LFT Recent Labs    09/01/23 0201  PROT 4.4*  ALBUMIN 2.0*  AST 32  ALT 41  ALKPHOS 43  BILITOT 0.5   PT/INR Recent Labs    09/01/23 1652 09/02/23 0443  LABPROT 22.2* 19.5*  INR 1.9* 1.6*    Studies/Results: DG Chest Port 1 View Result Date: 09/02/2023 CLINICAL DATA:  Shortness of breath. EXAM: PORTABLE CHEST 1 VIEW COMPARISON:  09/01/2023 FINDINGS: Stable cardiomediastinal contours. Both lungs are clear. No pleural fluid, interstitial edema or airspace consolidation. The visualized osseous structures are unremarkable. IMPRESSION: No active disease. Electronically Signed   By: Kimberley Penman M.D.   On: 09/02/2023 06:41   DG CHEST PORT 1 VIEW Result Date: 09/01/2023 CLINICAL DATA:  Cough. EXAM: PORTABLE CHEST 1 VIEW COMPARISON:  October 25, 2009. FINDINGS: Stable cardiomediastinal silhouette. Both lungs are clear. The visualized skeletal structures are unremarkable. IMPRESSION: No active disease. Electronically Signed   By: Rosalene Colon M.D.   On: 09/01/2023 09:45   CT HEAD WO CONTRAST Result  Date: 09/01/2023 CLINICAL DATA:  Blunt polytrauma, moderate to severe head trauma. Fell and hit head. Dizziness. EXAM: CT HEAD WITHOUT CONTRAST CT CERVICAL SPINE WITHOUT CONTRAST TECHNIQUE: Multidetector CT imaging of the head and cervical spine was performed following the standard protocol without intravenous contrast. Multiplanar CT image reconstructions of the cervical spine were also generated. RADIATION DOSE REDUCTION: This exam was performed according to the departmental dose-optimization program which includes automated exposure control, adjustment of the mA and/or kV according to patient size and/or use of iterative reconstruction technique.  COMPARISON:  None Available. FINDINGS: CT HEAD FINDINGS Brain: No intracranial hemorrhage, mass effect, or evidence of acute infarct. No hydrocephalus. No extra-axial fluid collection. Age related cerebral atrophy and chronic small vessel ischemic disease. Vascular: No hyperdense vessel. Intracranial arterial calcification. Skull: No fracture or focal lesion. Sinuses/Orbits: No acute finding. Other: None. CT CERVICAL SPINE FINDINGS Alignment: No evidence of traumatic malalignment. Skull base and vertebrae: No acute fracture. No primary bone lesion or focal pathologic process. Soft tissues and spinal canal: No prevertebral fluid or swelling. No visible canal hematoma. Disc levels: Multilevel spondylosis, disc space height loss, and degenerative endplate changes greatest at C3-C4, C4-C5, and C5-C6 where it is moderate. Posterior disc osteophyte complexes cause multilevel effacement of the ventral thecal sac greatest at C3-C4 where it is mild-to-moderate. Upper chest: No acute abnormality. Other: Carotid calcification. IMPRESSION: 1. No acute intracranial abnormality. 2. No acute fracture in the cervical spine. Electronically Signed   By: Rozell Cornet M.D.   On: 09/01/2023 02:25   CT CERVICAL SPINE WO CONTRAST Result Date: 09/01/2023 CLINICAL DATA:  Blunt polytrauma, moderate to severe head trauma. Fell and hit head. Dizziness. EXAM: CT HEAD WITHOUT CONTRAST CT CERVICAL SPINE WITHOUT CONTRAST TECHNIQUE: Multidetector CT imaging of the head and cervical spine was performed following the standard protocol without intravenous contrast. Multiplanar CT image reconstructions of the cervical spine were also generated. RADIATION DOSE REDUCTION: This exam was performed according to the departmental dose-optimization program which includes automated exposure control, adjustment of the mA and/or kV according to patient size and/or use of iterative reconstruction technique. COMPARISON:  None Available. FINDINGS: CT HEAD  FINDINGS Brain: No intracranial hemorrhage, mass effect, or evidence of acute infarct. No hydrocephalus. No extra-axial fluid collection. Age related cerebral atrophy and chronic small vessel ischemic disease. Vascular: No hyperdense vessel. Intracranial arterial calcification. Skull: No fracture or focal lesion. Sinuses/Orbits: No acute finding. Other: None. CT CERVICAL SPINE FINDINGS Alignment: No evidence of traumatic malalignment. Skull base and vertebrae: No acute fracture. No primary bone lesion or focal pathologic process. Soft tissues and spinal canal: No prevertebral fluid or swelling. No visible canal hematoma. Disc levels: Multilevel spondylosis, disc space height loss, and degenerative endplate changes greatest at C3-C4, C4-C5, and C5-C6 where it is moderate. Posterior disc osteophyte complexes cause multilevel effacement of the ventral thecal sac greatest at C3-C4 where it is mild-to-moderate. Upper chest: No acute abnormality. Other: Carotid calcification. IMPRESSION: 1. No acute intracranial abnormality. 2. No acute fracture in the cervical spine. Electronically Signed   By: Rozell Cornet M.D.   On: 09/01/2023 02:25       Assessment / Plan:    #23 73 year old white male admitted yesterday with acute GI bleed in setting of chronic Coumadin use, and supratherapeutic INR of 4.5 on admission Patient had a syncopal episode in his bathroom during this episode.  Several bowel movements at home prior to admission described as dark and bloody.  Initially felt likely to be a diverticular  hemorrhage, with recently documented diverticulosis at colonoscopy. Patient had not had any bowel movements for many hours yesterday and then last evening and during the night had 2-3 episodes of what were described as black appearing stools.  We cannot entirely rule out an upper GI source for bleeding at this time.   #2 anemia acute secondary to acute blood loss-hemoglobin currently stable at 8.4 #3  coagulopathy-resolved #4 chronic kidney disease #5.  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 today We will plan to proceed with EGD with Dr. Karene Oto today to rule out upper GI source for acute bleed.  Procedure was discussed in detail with the patient including indications risks and benefits and he is agreeable to proceed. Continue to trend hemoglobin and transfuse as indicated Continue to hold Coumadin. GI will follow with you.     Principal Problem:   Upper GI bleed Active Problems:   Acute blood loss anemia   Fall at home, initial encounter   Hypotension   Supratherapeutic INR   Hyperkalemia   Acute kidney injury superimposed on chronic kidney disease (HCC)   Upper respiratory infection   History of DVT (deep vein thrombosis)   History of pulmonary embolism   Symptomatic anemia   Hematochezia   ABLA (acute blood loss anemia)     LOS: 1 day   Amy EsterwoodPA-C  09/02/2023, 12:10 PM

## 2023-09-02 NOTE — Progress Notes (Addendum)
 Patient ID: Hunter Sandoval, male   DOB: December 04, 1950, 73 y.o.   MRN: [id number]     Attending physician's note   I have taken a history, reviewed the chart, and examined the patient. I performed a substantive portion of this encounter, including complete performance of at least one of the key components, in conjunction with the APP. I agree with the APP's note, impression, and recommendations with my edits.   Abiha Lukehart, DO, FACG [phone] office         Progress Note   Subjective   Day # 2 CC; acute GI bleed in setting of Coumadin with supratherapeutic INR  Patient says he feels a little better today than he did yesterday no complaints of abdominal pain, he did have at least 2 black bowel movements overnight  Labs-pro time 19.5/INR 1.6 BUN 62/creatinine 1.49 Hemoglobin 4 AM 8.4/hematocrit 24.5 stable   Objective   Vital signs in last 24 hours: Temp:  [98 F (36.7 C)-98.9 F (37.2 C)] 98.3 F (36.8 C) (05/07 0822) Pulse Rate:  [67-82] 67 (05/07 0822) Resp:  [15-23] 18 (05/07 0822) BP: (104-136)/(55-77) 128/71 (05/07 0822) SpO2:  [98 %-100 %] 99 % (05/07 0822) Last BM Date : 09/02/23 General:    Older white male in NAD Heart:  Regular rate and rhythm; no murmurs Lungs: Respirations even and unlabored, lungs CTA bilaterally Abdomen:  Soft, nontender and nondistended. Normal bowel sounds. Extremities:  Without edema. Neurologic:  Alert and oriented,  grossly normal neurologically. Psych:  Cooperative. Normal mood and affect.  Intake/Output from previous day: 05/06 0701 - 05/07 0700 In: 480 [P.O.:480] Out: 2850 [Urine:2850] Intake/Output this shift: Total I/O In: -  Out: 300 [Urine:300]  Lab Results: Recent Labs    09/01/23 0201 09/01/23 1025 09/01/23 1652 09/01/23 2008 09/02/23 0443  WBC 10.3  --   --   --  10.1  HGB 7.9*   < > 9.0* 8.4* 8.4*  HCT 24.8*   < > 26.6* 25.2* 24.5*  PLT 291  --   --   --  230   < > = values in this interval not  displayed.   BMET Recent Labs    09/01/23 0201 09/01/23 1025 09/02/23 0443  NA 137 139 139  K 5.7* 3.9 4.4  CL 113* 117* 114*  CO2 17* 15* 18*  GLUCOSE 182* 108* 102*  BUN 77* 67* 62*  CREATININE 2.07* 1.48* 1.49*  CALCIUM 7.1* 6.1* 8.0*   LFT Recent Labs    09/01/23 0201  PROT 4.4*  ALBUMIN 2.0*  AST 32  ALT 41  ALKPHOS 43  BILITOT 0.5   PT/INR Recent Labs    09/01/23 1652 09/02/23 0443  LABPROT 22.2* 19.5*  INR 1.9* 1.6*    Studies/Results: DG Chest Port 1 View Result Date: 09/02/2023 CLINICAL DATA:  Shortness of breath. EXAM: PORTABLE CHEST 1 VIEW COMPARISON:  09/01/2023 FINDINGS: Stable cardiomediastinal contours. Both lungs are clear. No pleural fluid, interstitial edema or airspace consolidation. The visualized osseous structures are unremarkable. IMPRESSION: No active disease. Electronically Signed   By: Kimberley Penman M.D.   On: 09/02/2023 06:41   DG CHEST PORT 1 VIEW Result Date: 09/01/2023 CLINICAL DATA:  Cough. EXAM: PORTABLE CHEST 1 VIEW COMPARISON:  October 25, 2009. FINDINGS: Stable cardiomediastinal silhouette. Both lungs are clear. The visualized skeletal structures are unremarkable. IMPRESSION: No active disease. Electronically Signed   By: Rosalene Colon M.D.   On: 09/01/2023 09:45   CT HEAD WO CONTRAST Result  Date: 09/01/2023 CLINICAL DATA:  Blunt polytrauma, moderate to severe head trauma. Fell and hit head. Dizziness. EXAM: CT HEAD WITHOUT CONTRAST CT CERVICAL SPINE WITHOUT CONTRAST TECHNIQUE: Multidetector CT imaging of the head and cervical spine was performed following the standard protocol without intravenous contrast. Multiplanar CT image reconstructions of the cervical spine were also generated. RADIATION DOSE REDUCTION: This exam was performed according to the departmental dose-optimization program which includes automated exposure control, adjustment of the mA and/or kV according to patient size and/or use of iterative reconstruction technique.  COMPARISON:  None Available. FINDINGS: CT HEAD FINDINGS Brain: No intracranial hemorrhage, mass effect, or evidence of acute infarct. No hydrocephalus. No extra-axial fluid collection. Age related cerebral atrophy and chronic small vessel ischemic disease. Vascular: No hyperdense vessel. Intracranial arterial calcification. Skull: No fracture or focal lesion. Sinuses/Orbits: No acute finding. Other: None. CT CERVICAL SPINE FINDINGS Alignment: No evidence of traumatic malalignment. Skull base and vertebrae: No acute fracture. No primary bone lesion or focal pathologic process. Soft tissues and spinal canal: No prevertebral fluid or swelling. No visible canal hematoma. Disc levels: Multilevel spondylosis, disc space height loss, and degenerative endplate changes greatest at C3-C4, C4-C5, and C5-C6 where it is moderate. Posterior disc osteophyte complexes cause multilevel effacement of the ventral thecal sac greatest at C3-C4 where it is mild-to-moderate. Upper chest: No acute abnormality. Other: Carotid calcification. IMPRESSION: 1. No acute intracranial abnormality. 2. No acute fracture in the cervical spine. Electronically Signed   By: Rozell Cornet M.D.   On: 09/01/2023 02:25   CT CERVICAL SPINE WO CONTRAST Result Date: 09/01/2023 CLINICAL DATA:  Blunt polytrauma, moderate to severe head trauma. Fell and hit head. Dizziness. EXAM: CT HEAD WITHOUT CONTRAST CT CERVICAL SPINE WITHOUT CONTRAST TECHNIQUE: Multidetector CT imaging of the head and cervical spine was performed following the standard protocol without intravenous contrast. Multiplanar CT image reconstructions of the cervical spine were also generated. RADIATION DOSE REDUCTION: This exam was performed according to the departmental dose-optimization program which includes automated exposure control, adjustment of the mA and/or kV according to patient size and/or use of iterative reconstruction technique. COMPARISON:  None Available. FINDINGS: CT HEAD  FINDINGS Brain: No intracranial hemorrhage, mass effect, or evidence of acute infarct. No hydrocephalus. No extra-axial fluid collection. Age related cerebral atrophy and chronic small vessel ischemic disease. Vascular: No hyperdense vessel. Intracranial arterial calcification. Skull: No fracture or focal lesion. Sinuses/Orbits: No acute finding. Other: None. CT CERVICAL SPINE FINDINGS Alignment: No evidence of traumatic malalignment. Skull base and vertebrae: No acute fracture. No primary bone lesion or focal pathologic process. Soft tissues and spinal canal: No prevertebral fluid or swelling. No visible canal hematoma. Disc levels: Multilevel spondylosis, disc space height loss, and degenerative endplate changes greatest at C3-C4, C4-C5, and C5-C6 where it is moderate. Posterior disc osteophyte complexes cause multilevel effacement of the ventral thecal sac greatest at C3-C4 where it is mild-to-moderate. Upper chest: No acute abnormality. Other: Carotid calcification. IMPRESSION: 1. No acute intracranial abnormality. 2. No acute fracture in the cervical spine. Electronically Signed   By: Rozell Cornet M.D.   On: 09/01/2023 02:25       Assessment / Plan:    #23 73 year old white male admitted yesterday with acute GI bleed in setting of chronic Coumadin use, and supratherapeutic INR of 4.5 on admission Patient had a syncopal episode in his bathroom during this episode.  Several bowel movements at home prior to admission described as dark and bloody.  Initially felt likely to be a diverticular  hemorrhage, with recently documented diverticulosis at colonoscopy. Patient had not had any bowel movements for many hours yesterday and then last evening and during the night had 2-3 episodes of what were described as black appearing stools.  We cannot entirely rule out an upper GI source for bleeding at this time.   #2 anemia acute secondary to acute blood loss-hemoglobin currently stable at 8.4 #3  coagulopathy-resolved #4 chronic kidney disease #5.  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 today We will plan to proceed with EGD with Dr. Karene Oto today to rule out upper GI source for acute bleed.  Procedure was discussed in detail with the patient including indications risks and benefits and he is agreeable to proceed. Continue to trend hemoglobin and transfuse as indicated Continue to hold Coumadin. GI will follow with you.     Principal Problem:   Upper GI bleed Active Problems:   Acute blood loss anemia   Fall at home, initial encounter   Hypotension   Supratherapeutic INR   Hyperkalemia   Acute kidney injury superimposed on chronic kidney disease (HCC)   Upper respiratory infection   History of DVT (deep vein thrombosis)   History of pulmonary embolism   Symptomatic anemia   Hematochezia   ABLA (acute blood loss anemia)     LOS: 1 day   Amy EsterwoodPA-C  09/02/2023, 12:10 PM

## 2023-09-02 NOTE — Plan of Care (Signed)
 Pt has rested quietly throughout the night. Alert and oriented. ON room air. SR on the monitor. Voids per urinal. Having black bloody stools. No complaints voiced.     Problem: Education: Goal: Knowledge of General Education information will improve Description: Including pain rating scale, medication(s)/side effects and non-pharmacologic comfort measures 09/02/2023 0556 by Alonna Art, RN Outcome: Progressing 09/02/2023 0549 by Alonna Art, RN Outcome: Progressing   Problem: Clinical Measurements: Goal: Respiratory complications will improve 09/02/2023 0556 by Alonna Art, RN Outcome: Progressing 09/02/2023 0549 by Alonna Art, RN Outcome: Progressing Goal: Cardiovascular complication will be avoided 09/02/2023 0556 by Alonna Art, RN Outcome: Progressing 09/02/2023 0549 by Alonna Art, RN Outcome: Progressing   Problem: Activity: Goal: Risk for activity intolerance will decrease Outcome: Progressing   Problem: Coping: Goal: Level of anxiety will decrease Outcome: Progressing   Problem: Pain Managment: Goal: General experience of comfort will improve and/or be controlled 09/02/2023 0556 by Alonna Art, RN Outcome: Progressing 09/02/2023 0549 by Alonna Art, RN Outcome: Progressing   Problem: Safety: Goal: Ability to remain free from injury will improve Outcome: Progressing

## 2023-09-02 NOTE — Interval H&P Note (Signed)
 History and Physical Interval Note:  09/02/2023 2:05 PM  Hunter Sandoval  has presented today for surgery, with the diagnosis of Melena, acute blood loss anemia.  The various methods of treatment have been discussed with the patient and family. After consideration of risks, benefits and other options for treatment, the patient has consented to  Procedure(s): EGD (ESOPHAGOGASTRODUODENOSCOPY) (Left) as a surgical intervention.  The patient's history has been reviewed, patient examined, no change in status, stable for surgery.  I have reviewed the patient's chart and labs.  Questions were answered to the patient's satisfaction.     Laquetta Plank Zarrah Loveland

## 2023-09-03 DIAGNOSIS — Z7901 Long term (current) use of anticoagulants: Secondary | ICD-10-CM | POA: Diagnosis not present

## 2023-09-03 DIAGNOSIS — K254 Chronic or unspecified gastric ulcer with hemorrhage: Secondary | ICD-10-CM | POA: Diagnosis not present

## 2023-09-03 DIAGNOSIS — R791 Abnormal coagulation profile: Secondary | ICD-10-CM | POA: Diagnosis not present

## 2023-09-03 DIAGNOSIS — K921 Melena: Secondary | ICD-10-CM | POA: Diagnosis not present

## 2023-09-03 DIAGNOSIS — K922 Gastrointestinal hemorrhage, unspecified: Secondary | ICD-10-CM | POA: Diagnosis not present

## 2023-09-03 LAB — BASIC METABOLIC PANEL WITH GFR
Anion gap: 5 (ref 5–15)
BUN: 35 mg/dL — ABNORMAL HIGH (ref 8–23)
CO2: 20 mmol/L — ABNORMAL LOW (ref 22–32)
Calcium: 8.1 mg/dL — ABNORMAL LOW (ref 8.9–10.3)
Chloride: 112 mmol/L — ABNORMAL HIGH (ref 98–111)
Creatinine, Ser: 1.4 mg/dL — ABNORMAL HIGH (ref 0.61–1.24)
GFR, Estimated: 54 mL/min — ABNORMAL LOW (ref >60)
Glucose, Bld: 104 mg/dL — ABNORMAL HIGH (ref 70–99)
Potassium: 4.3 mmol/L (ref 3.5–5.1)
Sodium: 137 mmol/L (ref 135–145)

## 2023-09-03 LAB — CBC WITH DIFFERENTIAL/PLATELET
Abs Immature Granulocytes: 0.08 10*3/uL — ABNORMAL HIGH (ref 0.00–0.07)
Basophils Absolute: 0.1 10*3/uL (ref 0.0–0.1)
Basophils Relative: 1 %
Eosinophils Absolute: 0.3 10*3/uL (ref 0.0–0.5)
Eosinophils Relative: 3 %
HCT: 23.4 % — ABNORMAL LOW (ref 39.0–52.0)
Hemoglobin: 7.9 g/dL — ABNORMAL LOW (ref 13.0–17.0)
Immature Granulocytes: 1 %
Lymphocytes Relative: 19 %
Lymphs Abs: 1.9 10*3/uL (ref 0.7–4.0)
MCH: 28.9 pg (ref 26.0–34.0)
MCHC: 33.8 g/dL (ref 30.0–36.0)
MCV: 85.7 fL (ref 80.0–100.0)
Monocytes Absolute: 0.6 10*3/uL (ref 0.1–1.0)
Monocytes Relative: 6 %
Neutro Abs: 7.2 10*3/uL (ref 1.7–7.7)
Neutrophils Relative %: 70 %
Platelets: 269 10*3/uL (ref 150–400)
RBC: 2.73 MIL/uL — ABNORMAL LOW (ref 4.22–5.81)
RDW: 14.4 % (ref 11.5–15.5)
WBC: 10.2 10*3/uL (ref 4.0–10.5)
nRBC: 0 % (ref 0.0–0.2)

## 2023-09-03 LAB — PROTIME-INR
INR: 1.5 — ABNORMAL HIGH (ref 0.8–1.2)
Prothrombin Time: 18.7 s — ABNORMAL HIGH (ref 11.4–15.2)

## 2023-09-03 LAB — MAGNESIUM: Magnesium: 1.8 mg/dL (ref 1.7–2.4)

## 2023-09-03 LAB — BRAIN NATRIURETIC PEPTIDE: B Natriuretic Peptide: 81 pg/mL (ref 0.0–100.0)

## 2023-09-03 MED ORDER — APIXABAN 5 MG PO TABS: 5.0000 mg | ORAL_TABLET | Freq: Two times a day (BID) | ORAL | Status: DC

## 2023-09-03 NOTE — TOC CAGE-AID Note (Signed)
 Transition of Care [facility]) - CAGE-AID Screening   Patient Details  Name: Hunter Sandoval MRN: [id number] Date of Birth: December 09, 1950  Transition of Care [facility]) CM/SW Contact:    Roosevelt Bisher E Myah Guynes, LCSW Phone Number: 09/03/2023, 11:21 AM   Clinical Narrative: Patient states he has a beer or glass of wine on occasion. Denies substance use resource needs.   CAGE-AID Screening:    Have You Ever Felt You Ought to Cut Down on Your Drinking or Drug Use?: No Have People Annoyed You By Critizing Your Drinking Or Drug Use?: No Have You Felt Bad Or Guilty About Your Drinking Or Drug Use?: No Have You Ever Had a Drink or Used Drugs First Thing In The Morning to Steady Your Nerves or to Get Rid of a Hangover?: No CAGE-AID Score: 0  Substance Abuse Education Offered: No

## 2023-09-03 NOTE — Plan of Care (Signed)
 Pt has rested quietly throughout the night with no distress noted. Alert and oriented. On room air. SR on the monitor. Voids per urinal. No bleeding noted. No complaints voiced.     Problem: Education: Goal: Knowledge of General Education information will improve Description: Including pain rating scale, medication(s)/side effects and non-pharmacologic comfort measures Outcome: Progressing   Problem: Health Behavior/Discharge Planning: Goal: Ability to manage health-related needs will improve Outcome: Progressing   Problem: Clinical Measurements: Goal: Respiratory complications will improve Outcome: Progressing Goal: Cardiovascular complication will be avoided Outcome: Progressing   Problem: Pain Managment: Goal: General experience of comfort will improve and/or be controlled Outcome: Progressing

## 2023-09-03 NOTE — Progress Notes (Signed)
 PROGRESS NOTE                                                                                                                                                                                                             Patient Demographics:    Hunter Sandoval, is a 73 y.o. male, DOB - [birth year], ZOX:[id number]  Outpatient Primary MD for the patient is Pete Brand, DO    LOS - 2  Admit date - 09/01/2023    Chief Complaint  Patient presents with   FOT       Brief Narrative (HPI from H&P)    73 y.o. male with medical history significant of hypertension, hyperlipidemia, history of DVT/PE on anticoagulation, CKD stage III, hypothyroidism, BPH, and obesity presents with lightheadedness and a fall.   Reports a 10-day history of having a upper respiratory illness, including achy joints, fatigue, anorexia, and a productive cough. He began antibiotics and cough medicine on Thursday of last week.  His fever had reached nearly 103F before starting antibiotics, which subsequently reduced the fever. He has been taking cold and flu medicine that possibly has ibuprofen, likely two a day, for joint aches and pains other symptoms.   He experienced lightheadedness and fell last night while attempting to go to the bathroom.  Patient did hit the right side of his head and thinks that if he lost consciousness it was for brief second.  He had been feeling lightheaded prior to the fall and had difficulty returning to the bathroom on previous occasions. After the fall, he realized he had soiled himself and his wife called EMS. He reports diarrhea that began yesterday around 6 PM, noting that the stool was black.  In the ER his INR was found to be high and hemoglobin was low.  He was admitted for GI bleed, hypotension, supratherapeutic INR and fall.    Subjective:   Patient in bed, appears comfortable, denies any headache, no fever, no chest pain or  pressure, no shortness of breath , no abdominal pain. No new focal weakness.   Assessment  & Plan :    Acute blood loss anemia secondary to suspected upper GI bleed, causing hypotension and fall with supratherapeutic INR.  Patient scented with melanotic stool, INR of 4.5, hemoglobin 7.9 and SBP in 80s, he recently had URI for which she had  used some NSAIDs and is also on Coumadin.  Overall picture suspicious for upper GI bleed with symptomatic acute blood loss anemia, he has received 2 units of packed RBCs, avoid further NSAIDs, continue IV PPI along with Carafate, GI on board, INR was reversed with vitamin K, CBC now stable he underwent EGD on 09/02/2023 with following results.  EGD - 09/02/23 - Gastritis with a few small erosions and shallow ulcers in the gastric antrum. 1 large ulcer in the gastric fundus with pigmented material. Given the need for anticoagulation, I closed that with 3 clips and injected with epinephrine for dual endoscopic therapy. The gastritis was biopsied to rule out H. pylori. Also with reflux induced esophagitis. Normal duodenum. Will treat with high-dose PPI, Carafate, full liquids today. Will need repeat upper endoscopy in 6/8 weeks to ensure appropriate healing of ulcer beds.    Fall, at home Hypotension Hold blood pressure medications, monitor H&H, has been adequately hydrated and he has received 2 units of packed RBC, pressure is improving hold off midodrine, PT OT and monitor.   Supratherapeutic INR - History of DVT and pulmonary embolism INR was reversed with vitamin K in the ER, INR stable, history of clots is remote, will also look into DOAC's upon discharge.  Agreeable to starting Eliquis upon discharge which will be prescribed, discussed with GI start Eliquis on 09/05/2023.   Acute kidney injury imposed on chronic kidney disease stage III with hyperkalemia Prerenal due to upper GI bleed and hypoperfusion, much improved, avoid nephrotoxins hold ARB and monitor.    Upper respiratory infection Prior to arrival.  Patient reports having a 10-day history of having a productive cough with fever, generalized myalgias, and malaise.  Recently treated with with 4 days of azithromycin -Negative respiratory viral panel -Stable procalcitonin -Stable chest x-ray - Azithromycin 500 mg p.o. x 1 dose complete 5-day course  - Continue supportive care      Condition - Fair  Family Communication  : None present  Code Status :  Full  Consults  :  GI  PUD Prophylaxis :     Procedures  :     EGD - 09/02/23 - Gastritis with a few small erosions and shallow ulcers in the gastric antrum. 1 large ulcer in the gastric fundus with pigmented material. Given the need for anticoagulation, I closed that with 3 clips and injected with epinephrine for dual endoscopic therapy. The gastritis was biopsied to rule out H. pylori. Also with reflux induced esophagitis. Normal duodenum. Will treat with high-dose PPI, Carafate, full liquids today. Will need repeat upper endoscopy in 6/8 weeks to ensure appropriate healing of ulcer beds.   CT head and C-spine.  Nonacute.      Disposition Plan  :    Status is: Inpatient  DVT Prophylaxis  :    Place and maintain sequential compression device Start: 09/01/23 0912    Lab Results  Component Value Date   PLT 269 09/03/2023    Diet :  Diet Order             DIET SOFT Fluid consistency: Thin  Diet effective now                    Inpatient Medications  Scheduled Meds:  pantoprazole (PROTONIX) IV  40 mg Intravenous Q12H   sodium chloride  flush  3 mL Intravenous Q12H   sucralfate  1 g Oral TID WC & HS   Continuous Infusions: PRN Meds:.acetaminophen **OR** acetaminophen, albuterol, ondansetron **  OR** ondansetron (ZOFRAN) IV  Antibiotics  :    Anti-infectives (From admission, onward)    Start     Dose/Rate Route Frequency Ordered Stop   09/01/23 0930  azithromycin (ZITHROMAX) tablet 250 mg        250 mg Oral   Once 09/01/23 0922 09/01/23 1135         Objective:   Vitals:   09/02/23 1944 09/03/23 0000 09/03/23 0500 09/03/23 0733  BP: 103/74 131/77 121/74 134/76  Pulse: 69 73 65 67  Resp: 18 15 18 17   Temp:  98.6 F (37 C) 98.8 F (37.1 C) 98.5 F (36.9 C)  TempSrc:  Oral Oral Oral  SpO2: 100% 99% 99% 98%  Weight:      Height:        Wt Readings from Last 3 Encounters:  09/02/23 96.2 kg  09/07/19 96.2 kg  08/11/19 96.2 kg     Intake/Output Summary (Last 24 hours) at 09/03/2023 0816 Last data filed at 09/03/2023 0735 Gross per 24 hour  Intake 440 ml  Output 2750 ml  Net -2310 ml     Physical Exam  Awake Alert, No new F.N deficits, Normal affect .AT,PERRAL Supple Neck, No JVD,   Symmetrical Chest wall movement, Good air movement bilaterally, CTAB RRR,No Gallops,Rubs or new Murmurs,  +ve B.Sounds, Abd Soft, No tenderness,   No Cyanosis, Clubbing or edema        Data Review:    Recent Labs  Lab 09/01/23 0201 09/01/23 1025 09/01/23 2008 09/02/23 0443 09/02/23 1552 09/02/23 2036 09/03/23 0510  WBC 10.3  --   --  10.1 13.2* 13.2* 10.2  HGB 7.9*   < > 8.4* 8.4* 9.4* 8.5* 7.9*  HCT 24.8*   < > 25.2* 24.5* 27.9* 25.2* 23.4*  PLT 291  --   --  230 302 287 269  MCV 92.2  --   --  84.8 85.1 86.9 85.7  MCH 29.4  --   --  29.1 28.7 29.3 28.9  MCHC 31.9  --   --  34.3 33.7 33.7 33.8  RDW 13.4  --   --  14.6 14.6 14.6 14.4  LYMPHSABS  --   --   --   --   --   --  1.9  MONOABS  --   --   --   --   --   --  0.6  EOSABS  --   --   --   --   --   --  0.3  BASOSABS  --   --   --   --   --   --  0.1   < > = values in this interval not displayed.    Recent Labs  Lab 09/01/23 0200 09/01/23 0201 09/01/23 1025 09/01/23 1652 09/02/23 0443 09/03/23 0510  NA 138 137 139  --  139 137  K 5.7* 5.7* 3.9  --  4.4 4.3  CL 108 113* 117*  --  114* 112*  CO2  --  17* 15*  --  18* 20*  ANIONGAP  --  7 7  --  7 5  GLUCOSE 171* 182* 108*  --  102* 104*  BUN 82* 77* 67*  --  62*  35*  CREATININE 2.20* 2.07* 1.48*  --  1.49* 1.40*  AST  --  32  --   --   --   --   ALT  --  41  --   --   --   --  ALKPHOS  --  43  --   --   --   --   BILITOT  --  0.5  --   --   --   --   ALBUMIN  --  2.0*  --   --   --   --   PROCALCITON  --   --  0.56  --   --   --   LATICACIDVEN  --  1.2  --   --   --   --   INR  --  4.5*  --  1.9* 1.6* 1.5*  TSH  --   --   --   --  5.613*  --   BNP  --   --   --   --  25.3 81.0  MG  --   --   --   --   --  1.8  CALCIUM  --  7.1* 6.1*  --  8.0* 8.1*      Recent Labs  Lab 09/01/23 0201 09/01/23 1025 09/01/23 1652 09/02/23 0443 09/03/23 0510  PROCALCITON  --  0.56  --   --   --   LATICACIDVEN 1.2  --   --   --   --   INR 4.5*  --  1.9* 1.6* 1.5*  TSH  --   --   --  5.613*  --   BNP  --   --   --  25.3 81.0  MG  --   --   --   --  1.8  CALCIUM 7.1* 6.1*  --  8.0* 8.1*    Lab Results  Component Value Date   CHOL 210 (HH) 11/22/2007   HDL 45.7 11/22/2007   LDLDIRECT 136.6 11/22/2007   TRIG 119 11/22/2007   CHOLHDL 4.6 CALC 11/22/2007    No results found for: "HGBA1C" Recent Labs    09/02/23 0443  TSH 5.613*  FREET4 1.05      Micro Results Recent Results (from the past 240 hours)  Resp panel by RT-PCR (RSV, Flu A&B, Covid) Anterior Nasal Swab     Status: None   Collection Time: 09/01/23  8:33 AM   Specimen: Anterior Nasal Swab  Result Value Ref Range Status   SARS Coronavirus 2 by RT PCR NEGATIVE NEGATIVE Final   Influenza A by PCR NEGATIVE NEGATIVE Final   Influenza B by PCR NEGATIVE NEGATIVE Final    Comment: (NOTE) The Xpert Xpress SARS-CoV-2/FLU/RSV plus assay is intended as an aid in the diagnosis of influenza from Nasopharyngeal swab specimens and should not be used as a sole basis for treatment. Nasal washings and aspirates are unacceptable for Xpert Xpress SARS-CoV-2/FLU/RSV testing.  Fact Sheet for Patients: BloggerCourse.com  Fact Sheet for Healthcare  Providers: SeriousBroker.it  This test is not yet approved or cleared by the [place]  FDA and has been authorized for detection and/or diagnosis of SARS-CoV-2 by FDA under an Emergency Use Authorization (EUA). This EUA will remain in effect (meaning this test can be used) for the duration of the COVID-19 declaration under Section 564(b)(1) of the Act, 21 U.S.C. section 360bbb-3(b)(1), unless the authorization is terminated or revoked.     Resp Syncytial Virus by PCR NEGATIVE NEGATIVE Final    Comment: (NOTE) Fact Sheet for Patients: BloggerCourse.com  Fact Sheet for Healthcare Providers: SeriousBroker.it  This test is not yet approved or cleared by the [place]  FDA and has been authorized for detection and/or diagnosis of SARS-CoV-2 by FDA under an Emergency  Use Authorization (EUA). This EUA will remain in effect (meaning this test can be used) for the duration of the COVID-19 declaration under Section 564(b)(1) of the Act, 21 U.S.C. section 360bbb-3(b)(1), unless the authorization is terminated or revoked.  Performed at [facility], [address], [place], [place] [address]     Radiology Report DG Chest Port 1 View Result Date: 09/02/2023 CLINICAL DATA:  Shortness of breath. EXAM: PORTABLE CHEST 1 VIEW COMPARISON:  09/01/2023 FINDINGS: Stable cardiomediastinal contours. Both lungs are clear. No pleural fluid, interstitial edema or airspace consolidation. The visualized osseous structures are unremarkable. IMPRESSION: No active disease. Electronically Signed   By: Kimberley Penman M.D.   On: 09/02/2023 06:41   DG CHEST PORT 1 VIEW Result Date: 09/01/2023 CLINICAL DATA:  Cough. EXAM: PORTABLE CHEST 1 VIEW COMPARISON:  October 25, 2009. FINDINGS: Stable cardiomediastinal silhouette. Both lungs are clear. The visualized skeletal structures are unremarkable. IMPRESSION: No active disease.  Electronically Signed   By: Rosalene Colon M.D.   On: 09/01/2023 09:45     Signature  -   Lynnwood Sauer M.D on 09/03/2023 at 8:16 AM   -  To page go to [email or web address]

## 2023-09-03 NOTE — Progress Notes (Signed)
 Ok to transition to apixaban 5mg  PO BID on 5/10 per Dr. Zelda Hickman.  Ivery Marking, PharmD, BCIDP, AAHIVP, CPP Infectious Disease Pharmacist 09/03/2023 9:09 AM

## 2023-09-03 NOTE — Discharge Instructions (Addendum)
 Information on my medicine - ELIQUIS  (apixaban )  This medication education was reviewed with me or my healthcare representative as part of my discharge preparation.    Why was Eliquis  prescribed for you? Eliquis  was prescribed to treat blood clots that may have been found in the veins of your legs (deep vein thrombosis) or in your lungs (pulmonary embolism) and to reduce the risk of them occurring again.  What do You need to know about Eliquis  ? Continue Eliquis  5 mg tablet taken TWICE daily.  Eliquis  may be taken with or without food.   Try to take the dose about the same time in the morning and in the evening. If you have difficulty swallowing the tablet whole please discuss with your pharmacist how to take the medication safely.  Take Eliquis  exactly as prescribed and DO NOT stop taking Eliquis  without talking to the doctor who prescribed the medication.  Stopping may increase your risk of developing a new blood clot.  Refill your prescription before you run out.  After discharge, you should have regular check-up appointments with your healthcare provider that is prescribing your Eliquis .    What do you do if you miss a dose? If a dose of ELIQUIS  is not taken at the scheduled time, take it as soon as possible on the same day and twice-daily administration should be resumed. The dose should not be doubled to make up for a missed dose.  Important Safety Information A possible side effect of Eliquis  is bleeding. You should call your healthcare provider right away if you experience any of the following: Bleeding from an injury or your nose that does not stop. Unusual colored urine (red or dark brown) or unusual colored stools (red or black). Unusual bruising for unknown reasons. A serious fall or if you hit your head (even if there is no bleeding).  Some medicines may interact with Eliquis  and might increase your risk of bleeding or clotting while on Eliquis . To help avoid this,  consult your healthcare provider or pharmacist prior to using any new prescription or non-prescription medications, including herbals, vitamins, non-steroidal anti-inflammatory drugs (NSAIDs) and supplements.  This website has more information on Eliquis  (apixaban ): [email or web address] .[email or web address] Hunter Sandoval     Follow with Primary MD Pete Brand, DO in 3 days   Get CBC, CMP, 2 view Chest X ray -  checked next visit with your primary MD    Activity: As tolerated with Full fall precautions use walker/cane & assistance as needed  Disposition Home    Diet: Heart Healthy    Special Instructions: If you have smoked or chewed Tobacco  in the last 2 yrs please stop smoking, stop any regular Alcohol  and or any Recreational drug use.  On your next visit with your primary care physician please Get Medicines reviewed and adjusted.  Please request your Prim.MD to go over all Hospital Tests and Procedure/Radiological results at the follow up, please get all Hospital records sent to your Prim MD by signing hospital release before you go home.  If you experience worsening of your admission symptoms, develop shortness of breath, life threatening emergency, suicidal or homicidal thoughts you must seek medical attention immediately by calling 911 or calling your MD immediately  if symptoms less severe.  You Must read complete instructions/literature along with all the possible adverse reactions/side effects for all the Medicines you take and that have been prescribed to you. Take any new Medicines after you have completely understood and accpet all the possible adverse reactions/side  effects.   Do not drive when taking Pain medications.  Do not take more than prescribed Pain, Sleep and Anxiety Medications  Wear Seat belts while driving.

## 2023-09-03 NOTE — Progress Notes (Addendum)
   09/03/23 1116  Mobility  Activity Ambulated with assistance in hallway  Level of Assistance Contact guard assist, steadying assist  Assistive Device None  Distance Ambulated (ft) 150 ft  Activity Response Tolerated fair  Mobility Referral Yes  Mobility visit 1 Mobility  Mobility Specialist Start Time (ACUTE ONLY) 1116  Mobility Specialist Stop Time (ACUTE ONLY) 1139  Mobility Specialist Time Calculation (min) (ACUTE ONLY) 23 min   Mobility Specialist: Progress Note - Visits:2  Pre-Mobility:      HR 74, SpO2 98% Post-Mobility:    HR 80, SpO2 97%  Pt agreeable to mobility session - received in bed. Pt was asymptomatic throughout session with no complaints. When asked pt noted his gait was unsteady, pt stated "because I haven't walked in a couple of days. Pt drifting L to R throughout, refused to using RW stated " I don't need it."  Returned to chair with all needs met - call bell within reach. Chair alarm on.   __________________________________  Pre-Mobility:      HR 91, SpO2 98% Post-Mobility:    HR 76, SpO2 100%  Pt agreeable to mobility session- received in chair - requesting to go back to bed. Pt was asymptomatic throughout session with no complaints. Returned to bed with all needs met. Bed alarm on.   Isla Mari, BS Mobility Specialist Please contact via SecureChat or  Rehab office at [phone].

## 2023-09-03 NOTE — Progress Notes (Addendum)
 Patient ID: Hunter Sandoval, male   DOB: 1951/01/21, 73 y.o.   MRN: [id number]     Attending physician's note   I have taken a history, reviewed the chart, and examined the patient. I performed a substantive portion of this encounter, including complete performance of at least one of the key components, in conjunction with the APP. I agree with the APP's note, impression, and recommendations with my edits.   H/H slightly down today with hemoglobin 7.9 (from 8.5 yesterday), but no BM since yesterday.  No bleeding.  Tolerating p.o. intake.  Wife at bedside.  - Continue high-dose PPI - Continue Carafate - Path pending from EGD - Continue serial CBC checks - Holding anticoagulation.  Plan is to start Eliquis as outpatient (instead of warfarin) - If H/H stable, potentially ok for DC tomorrow.  If H/H is still downtrending, may require relook endoscopy prior to DC, particular with plan to resume anticoagulation - Otherwise, plan is for repeat upper endoscopy in 6-8 weeks - Inpatient GI service will continue to follow  Donata Reddick, DO, FACG [phone] office          Progress Note   Subjective   Day # 2 CC; acute GI bleed in setting of supratherapeutic INR  EGD yesterday-LA grade B esophagitis, moderate gastritis and shallow ulcerations in the gastric body and antrum, there was 1 nonbleeding Cratered gastric ulcer with pigmented material found in the gastric fundus 8 mm in size.  This was treated with Hemoclip x 3, and epinephrine  Patient says he has not had a bowel movement since yesterday and is overall feeling better, no complaints of abdominal pain  Labs today-WBC 10.2/hemoglobin 7.9/hematocrit 23.4 down from 8.5 yesterday   Objective   Vital signs in last 24 hours: Temp:  [98.4 F (36.9 C)-98.8 F (37.1 C)] 98.5 F (36.9 C) (05/08 0733) Pulse Rate:  [63-81] 81 (05/08 1200) Resp:  [15-20] 20 (05/08 1200) BP: (102-134)/(61-81) 102/61 (05/08 1200) SpO2:  [98 %-100 %]  99 % (05/08 1200) Last BM Date : 09/02/23 General:    Older white male in NAD, wife at bedside Heart:  Regular rate and rhythm; no murmurs Lungs: Respirations even and unlabored, lungs CTA bilaterally Abdomen:  Soft, nontender and nondistended. Normal bowel sounds. Extremities:  Without edema. Neurologic:  Alert and oriented,  grossly normal neurologically. Psych:  Cooperative. Normal mood and affect.  Intake/Output from previous day: 05/07 0701 - 05/08 0700 In: 440 [P.O.:240; I.V.:200] Out: 2650 [Urine:2650] Intake/Output this shift: Total I/O In: -  Out: 850 [Urine:850]  Lab Results: Recent Labs    09/02/23 1552 09/02/23 2036 09/03/23 0510  WBC 13.2* 13.2* 10.2  HGB 9.4* 8.5* 7.9*  HCT 27.9* 25.2* 23.4*  PLT 302 287 269   BMET Recent Labs    09/01/23 1025 09/02/23 0443 09/03/23 0510  NA 139 139 137  K 3.9 4.4 4.3  CL 117* 114* 112*  CO2 15* 18* 20*  GLUCOSE 108* 102* 104*  BUN 67* 62* 35*  CREATININE 1.48* 1.49* 1.40*  CALCIUM 6.1* 8.0* 8.1*   LFT Recent Labs    09/01/23 0201  PROT 4.4*  ALBUMIN 2.0*  AST 32  ALT 41  ALKPHOS 43  BILITOT 0.5   PT/INR Recent Labs    09/02/23 0443 09/03/23 0510  LABPROT 19.5* 18.7*  INR 1.6* 1.5*    Studies/Results: DG Chest Port 1 View Result Date: 09/02/2023 CLINICAL DATA:  Shortness of breath. EXAM: PORTABLE CHEST 1 VIEW COMPARISON:  09/01/2023  FINDINGS: Stable cardiomediastinal contours. Both lungs are clear. No pleural fluid, interstitial edema or airspace consolidation. The visualized osseous structures are unremarkable. IMPRESSION: No active disease. Electronically Signed   By: Kimberley Penman M.D.   On: 09/02/2023 06:41       Assessment / Plan:    #21 73 year old white male admitted 2 days ago with acute GI bleed with syncope in setting of chronic Coumadin use and supratherapeutic INR of 4.5 on admission.  Initially thought perhaps lower GI bleeding secondary to diverticulosis but patient had melenic  stool after admission. EGD yesterday with finding of some shallow gastric ulcerations and a larger cratered gastric ulcer, with pigmented material felt to be the source of the bleed and treated with Hemoclip x 3, and epinephrine injection.  Has not had any evidence of bleeding since.  He has had a mild drift in hemoglobin.  Remains off Coumadin  #2 history of nonprovoked DVT/PE  #3 chronic kidney disease  Plan; continue twice daily PPI IV Continue Carafate suspension twice daily Await biopsies/rule out H. Pylori Continue to hold anticoagulation If hemoglobin is very stable tomorrow, can consider discharged home tomorrow, will need to stay off anticoagulation for the next few days and may be okay to start Eliquis by Monday. He will need repeat outpatient hemoglobin in 1 week and we will arrange for outpatient follow-up with Dr. Karene Oto for repeat EGD in 6 to 8 weeks to assess for healing. No NSAIDs or aspirin, Tylenol only     Principal Problem:   Upper GI bleed Active Problems:   Acute blood loss anemia   Fall at home, initial encounter   Hypotension   Supratherapeutic INR   Hyperkalemia   Acute kidney injury superimposed on chronic kidney disease (HCC)   Upper respiratory infection   History of DVT (deep vein thrombosis)   History of pulmonary embolism   Symptomatic anemia   Hematochezia   ABLA (acute blood loss anemia)   Gastric ulcer with hemorrhage   Gastritis and gastroduodenitis   Gastroesophageal reflux disease with esophagitis without hemorrhage     LOS: 2 days   Amy EsterwoodPA-C  09/03/2023, 3:44 PM

## 2023-09-04 ENCOUNTER — Encounter: Payer: Self-pay | Admitting: Gastroenterology

## 2023-09-04 ENCOUNTER — Encounter (HOSPITAL_COMMUNITY): Payer: Self-pay | Admitting: Gastroenterology

## 2023-09-04 ENCOUNTER — Other Ambulatory Visit (HOSPITAL_COMMUNITY): Payer: Self-pay

## 2023-09-04 DIAGNOSIS — K922 Gastrointestinal hemorrhage, unspecified: Secondary | ICD-10-CM | POA: Diagnosis not present

## 2023-09-04 LAB — CBC WITH DIFFERENTIAL/PLATELET
Abs Immature Granulocytes: 0.06 10*3/uL (ref 0.00–0.07)
Basophils Absolute: 0.1 10*3/uL (ref 0.0–0.1)
Basophils Relative: 1 %
Eosinophils Absolute: 0.4 10*3/uL (ref 0.0–0.5)
Eosinophils Relative: 5 %
HCT: 24.1 % — ABNORMAL LOW (ref 39.0–52.0)
Hemoglobin: 8.2 g/dL — ABNORMAL LOW (ref 13.0–17.0)
Immature Granulocytes: 1 %
Lymphocytes Relative: 23 %
Lymphs Abs: 2 10*3/uL (ref 0.7–4.0)
MCH: 29.1 pg (ref 26.0–34.0)
MCHC: 34 g/dL (ref 30.0–36.0)
MCV: 85.5 fL (ref 80.0–100.0)
Monocytes Absolute: 0.5 10*3/uL (ref 0.1–1.0)
Monocytes Relative: 6 %
Neutro Abs: 5.7 10*3/uL (ref 1.7–7.7)
Neutrophils Relative %: 64 %
Platelets: 307 10*3/uL (ref 150–400)
RBC: 2.82 MIL/uL — ABNORMAL LOW (ref 4.22–5.81)
RDW: 14.2 % (ref 11.5–15.5)
WBC: 8.7 10*3/uL (ref 4.0–10.5)
nRBC: 0 % (ref 0.0–0.2)

## 2023-09-04 LAB — BASIC METABOLIC PANEL WITH GFR
Anion gap: 7 (ref 5–15)
BUN: 26 mg/dL — ABNORMAL HIGH (ref 8–23)
CO2: 23 mmol/L (ref 22–32)
Calcium: 8.4 mg/dL — ABNORMAL LOW (ref 8.9–10.3)
Chloride: 109 mmol/L (ref 98–111)
Creatinine, Ser: 1.5 mg/dL — ABNORMAL HIGH (ref 0.61–1.24)
GFR, Estimated: 50 mL/min — ABNORMAL LOW (ref >60)
Glucose, Bld: 102 mg/dL — ABNORMAL HIGH (ref 70–99)
Potassium: 4.3 mmol/L (ref 3.5–5.1)
Sodium: 139 mmol/L (ref 135–145)

## 2023-09-04 LAB — MAGNESIUM: Magnesium: 1.8 mg/dL (ref 1.7–2.4)

## 2023-09-04 LAB — SURGICAL PATHOLOGY

## 2023-09-04 MED ORDER — AMLODIPINE BESYLATE 2.5 MG PO TABS: 2.5000 mg | ORAL_TABLET | Freq: Every day | ORAL | Status: AC

## 2023-09-04 MED ORDER — SUCRALFATE 1 G PO TABS
1.0000 g | ORAL_TABLET | Freq: Three times a day (TID) | ORAL | 0 refills | Status: DC
Start: 2023-09-04 — End: 2023-09-29
  Filled 2023-09-04: qty 80, 20d supply, fill #0

## 2023-09-04 MED ORDER — APIXABAN 5 MG PO TABS
5.0000 mg | ORAL_TABLET | Freq: Two times a day (BID) | ORAL | 0 refills | Status: AC
Start: 2023-09-06 — End: ?
  Filled 2023-09-04: qty 60, 30d supply, fill #0

## 2023-09-04 MED ORDER — PANTOPRAZOLE SODIUM 40 MG PO TBEC
40.0000 mg | DELAYED_RELEASE_TABLET | Freq: Two times a day (BID) | ORAL | 2 refills | Status: DC
  Filled 2023-09-04: qty 60, 30d supply, fill #0

## 2023-09-04 MED ORDER — PANTOPRAZOLE SODIUM 40 MG PO TBEC: 40.0000 mg | DELAYED_RELEASE_TABLET | Freq: Two times a day (BID) | ORAL | Status: DC

## 2023-09-04 NOTE — TOC Transition Note (Signed)
 Transition of Care [facility]) - Discharge Note   Patient Details  Name: Hunter Sandoval MRN: [id number] Date of Birth: 1950-05-07  [facility] CM/SW Contact:  Eusebio High, RN Phone Number: 09/04/2023, 10:50 AM   Clinical Narrative:     Patient will DC to home today. No TOC needs identified. Patient will follow up as directed on AVS             Patient Goals and CMS Choice            Discharge Placement                       Discharge Plan and Services Additional resources added to the After Visit Summary for                                       Social Drivers of Health (SDOH) Interventions SDOH Screenings   Food Insecurity: No Food Insecurity (09/01/2023)  Housing: Low Risk  (09/01/2023)  Transportation Needs: No Transportation Needs (09/01/2023)  Utilities: Not At Risk (09/01/2023)  Social Connections: Moderately Integrated (09/01/2023)  Tobacco Use: High Risk (09/02/2023)     Readmission Risk Interventions     No data to display

## 2023-09-04 NOTE — Discharge Summary (Signed)
 Hunter Sandoval ZOX:[id number] DOB: 1950/06/07 DOA: 09/01/2023  PCP: Pete Brand, DO  Admit date: 09/01/2023  Discharge date: 09/04/2023  Admitted From: Home   Disposition:  Home   Recommendations for Outpatient Follow-up:   Follow up with PCP in 1-2 weeks  PCP Please obtain BMP/CBC, 2 view CXR in 1week,  (see Discharge instructions)   PCP Please follow up on the following pending results: Monitor CBC closely needs close outpatient GI follow-up   Home Health: None   Equipment/Devices: None  Consultations: GI Discharge Condition: Stable    CODE STATUS: Full    Diet Recommendation: Heart Healthy     Chief Complaint  Patient presents with   FOT     Brief history of present illness from the day of admission and additional interim summary    73 y.o. male with medical history significant of hypertension, hyperlipidemia, history of DVT/PE on anticoagulation, CKD stage III, hypothyroidism, BPH, and obesity presents with lightheadedness and a fall.   Reports a 10-day history of having a upper respiratory illness, including achy joints, fatigue, anorexia, and a productive cough. He began antibiotics and cough medicine on Thursday of last week.  His fever had reached nearly 103F before starting antibiotics, which subsequently reduced the fever. He has been taking cold and flu medicine that possibly has ibuprofen, likely two a day, for joint aches and pains other symptoms.   He experienced lightheadedness and fell last night while attempting to go to the bathroom.  Patient did hit the right side of his head and thinks that if he lost consciousness it was for brief second.  He had been feeling lightheaded prior to the fall and had difficulty returning to the bathroom on previous occasions. After the fall, he realized he had  soiled himself and his wife called EMS. He reports diarrhea that began yesterday around 6 PM, noting that the stool was black.  In the ER his INR was found to be high and hemoglobin was low.  He was admitted for GI bleed, hypotension, supratherapeutic INR and fall.                                                                 Hospital Course   Acute blood loss anemia secondary to suspected upper GI bleed, causing hypotension and fall with supratherapeutic INR.   Patient scented with melanotic stool, INR of 4.5, hemoglobin 7.9 and SBP in 80s, he recently had URI for which she had used some NSAIDs and is also on Coumadin.  Overall picture suspicious for upper GI bleed with symptomatic acute blood loss anemia, he has received 2 units of packed RBCs, avoid further NSAIDs, continue IV PPI along with Carafate , GI on board, INR was reversed with vitamin K, CBC now stable he underwent EGD on 09/02/2023 with  following results.  Will be discharged with PPI twice daily, Carafate , follow-up with PCP in 3 days and GI in 2 weeks Reetz repeat EGD in 6 to 8 weeks.   EGD - 09/02/23 - Gastritis with a few small erosions and shallow ulcers in the gastric antrum. 1 large ulcer in the gastric fundus with pigmented material. Given the need for anticoagulation, I closed that with 3 clips and injected with epinephrine  for dual endoscopic therapy. The gastritis was biopsied to rule out H. pylori. Also with reflux induced esophagitis. Normal duodenum. Will treat with high-dose PPI, Carafate , will require EGD in 6 to 8 weeks again with [facility].        Fall, at home Hypotension Hold blood pressure medications, monitor H&H, has been adequately hydrated and he has received 2 units of packed RBC, pressure is improving hold off midodrine , PT OT and monitor.   Supratherapeutic INR - History of DVT and pulmonary embolism INR was reversed with vitamin K in the ER, INR stable, history of clots is remote, will also look into DOAC's upon  discharge.  Started on Eliquis  from tomorrow.  Cleared by GI to do so.   Acute kidney injury imposed on chronic kidney disease stage III with hyperkalemia Prerenal due to upper GI bleed and hypoperfusion, much improved, avoid nephrotoxins hold ARB follow-up with PCP for 3 days for repeat blood pressure, CBC, BMP and readjust blood pressure medications.   Upper respiratory infection Prior to arrival.  Patient reports having a 10-day history of having a productive cough with fever, generalized myalgias, and malaise.  Currently much improved -Negative respiratory viral panel -Stable procalcitonin -Stable chest x-ray - Azithromycin  500 mg p.o. x 1 dose complete 5-day course  - Clinically resolved    Discharge diagnosis     Principal Problem:   Upper GI bleed Active Problems:   Acute blood loss anemia   Fall at home, initial encounter   Hypotension   Supratherapeutic INR   History of DVT (deep vein thrombosis)   History of pulmonary embolism   Hyperkalemia   Acute kidney injury superimposed on chronic kidney disease (HCC)   Upper respiratory infection   Symptomatic anemia   Hematochezia   ABLA (acute blood loss anemia)   Gastric ulcer with hemorrhage   Gastritis and gastroduodenitis   Gastroesophageal reflux disease with esophagitis without hemorrhage    Discharge instructions    Discharge Instructions     Diet - low sodium heart healthy   Complete by: As directed    Discharge instructions   Complete by: As directed    Follow with Primary MD Pete Brand, DO in 3 days   Get CBC, CMP, 2 view Chest X ray -  checked next visit with your primary MD    Activity: As tolerated with Full fall precautions use walker/cane & assistance as needed  Disposition Home    Diet: Heart Healthy    Special Instructions: If you have smoked or chewed Tobacco  in the last 2 yrs please stop smoking, stop any regular Alcohol  and or any Recreational drug use.  On your next visit with your  primary care physician please Get Medicines reviewed and adjusted.  Please request your Prim.MD to go over all Hospital Tests and Procedure/Radiological results at the follow up, please get all Hospital records sent to your Prim MD by signing hospital release before you go home.  If you experience worsening of your admission symptoms, develop shortness of breath, life threatening emergency, suicidal  or homicidal thoughts you must seek medical attention immediately by calling 911 or calling your MD immediately  if symptoms less severe.  You Must read complete instructions/literature along with all the possible adverse reactions/side effects for all the Medicines you take and that have been prescribed to you. Take any new Medicines after you have completely understood and accpet all the possible adverse reactions/side effects.   Do not drive when taking Pain medications.  Do not take more than prescribed Pain, Sleep and Anxiety Medications  Wear Seat belts while driving.   Increase activity slowly   Complete by: As directed    No wound care   Complete by: As directed        Discharge Medications   Allergies as of 09/04/2023   No Known Allergies      Medication List     STOP taking these medications    losartan 100 MG tablet Commonly known as: COZAAR   warfarin 5 MG tablet Commonly known as: COUMADIN       TAKE these medications    amLODipine 2.5 MG tablet Commonly known as: NORVASC Take 1 tablet (2.5 mg total) by mouth at bedtime. Start taking on: Sep 05, 2023   apixaban  5 MG Tabs tablet Commonly known as: ELIQUIS  Take 1 tablet (5 mg total) by mouth 2 (two) times daily. Start taking on: Sep 06, 2023   azithromycin  250 MG tablet Commonly known as: ZITHROMAX  Take by mouth.   B COMPLEX PO Take 1 tablet by mouth daily.   benzonatate 200 MG capsule Commonly known as: TESSALON Take 200 mg by mouth 3 (three) times daily as needed.   MAGNESIUM PO Take 1 tablet by  mouth daily.   pantoprazole  40 MG tablet Commonly known as: PROTONIX  Take 1 tablet (40 mg total) by mouth 2 (two) times daily.   sucralfate  1 GM/10ML suspension Commonly known as: CARAFATE  Take 10 mLs (1 g total) by mouth 4 (four) times daily -  with meals and at bedtime for 20 days.   vitamin C 1000 MG tablet Take 1,000 mg by mouth daily.   Vitamin D3 50 MCG (2000 UT) capsule Take 2,000 Units by mouth daily.         Follow-up Information     Pete Brand, DO. Schedule an appointment as soon as possible for a visit in 3 day(s).   Specialty: Family Medicine Contact information: [address] [place] [address] [phone]         Annis Kinder, DO. Schedule an appointment as soon as possible for a visit in 2 week(s).   Specialty: Gastroenterology Contact information: [address] [place] [address] [phone]                 Major procedures and Radiology Reports - PLEASE review detailed and final reports thoroughly  -       DG Chest Port 1 View Result Date: 09/02/2023 CLINICAL DATA:  Shortness of breath. EXAM: PORTABLE CHEST 1 VIEW COMPARISON:  09/01/2023 FINDINGS: Stable cardiomediastinal contours. Both lungs are clear. No pleural fluid, interstitial edema or airspace consolidation. The visualized osseous structures are unremarkable. IMPRESSION: No active disease. Electronically Signed   By: Kimberley Penman M.D.   On: 09/02/2023 06:41   DG CHEST PORT 1 VIEW Result Date: 09/01/2023 CLINICAL DATA:  Cough. EXAM: PORTABLE CHEST 1 VIEW COMPARISON:  October 25, 2009. FINDINGS: Stable cardiomediastinal silhouette. Both lungs are clear. The visualized skeletal structures are unremarkable. IMPRESSION: No active  disease. Electronically Signed   By: Rosalene Colon M.D.   On: 09/01/2023 09:45   CT HEAD WO CONTRAST Result Date: 09/01/2023 CLINICAL DATA:  Blunt polytrauma, moderate to severe head trauma. Fell and hit head. Dizziness. EXAM: CT  HEAD WITHOUT CONTRAST CT CERVICAL SPINE WITHOUT CONTRAST TECHNIQUE: Multidetector CT imaging of the head and cervical spine was performed following the standard protocol without intravenous contrast. Multiplanar CT image reconstructions of the cervical spine were also generated. RADIATION DOSE REDUCTION: This exam was performed according to the departmental dose-optimization program which includes automated exposure control, adjustment of the mA and/or kV according to patient size and/or use of iterative reconstruction technique. COMPARISON:  None Available. FINDINGS: CT HEAD FINDINGS Brain: No intracranial hemorrhage, mass effect, or evidence of acute infarct. No hydrocephalus. No extra-axial fluid collection. Age related cerebral atrophy and chronic small vessel ischemic disease. Vascular: No hyperdense vessel. Intracranial arterial calcification. Skull: No fracture or focal lesion. Sinuses/Orbits: No acute finding. Other: None. CT CERVICAL SPINE FINDINGS Alignment: No evidence of traumatic malalignment. Skull base and vertebrae: No acute fracture. No primary bone lesion or focal pathologic process. Soft tissues and spinal canal: No prevertebral fluid or swelling. No visible canal hematoma. Disc levels: Multilevel spondylosis, disc space height loss, and degenerative endplate changes greatest at C3-C4, C4-C5, and C5-C6 where it is moderate. Posterior disc osteophyte complexes cause multilevel effacement of the ventral thecal sac greatest at C3-C4 where it is mild-to-moderate. Upper chest: No acute abnormality. Other: Carotid calcification. IMPRESSION: 1. No acute intracranial abnormality. 2. No acute fracture in the cervical spine. Electronically Signed   By: Rozell Cornet M.D.   On: 09/01/2023 02:25   CT CERVICAL SPINE WO CONTRAST Result Date: 09/01/2023 CLINICAL DATA:  Blunt polytrauma, moderate to severe head trauma. Fell and hit head. Dizziness. EXAM: CT HEAD WITHOUT CONTRAST CT CERVICAL SPINE WITHOUT  CONTRAST TECHNIQUE: Multidetector CT imaging of the head and cervical spine was performed following the standard protocol without intravenous contrast. Multiplanar CT image reconstructions of the cervical spine were also generated. RADIATION DOSE REDUCTION: This exam was performed according to the departmental dose-optimization program which includes automated exposure control, adjustment of the mA and/or kV according to patient size and/or use of iterative reconstruction technique. COMPARISON:  None Available. FINDINGS: CT HEAD FINDINGS Brain: No intracranial hemorrhage, mass effect, or evidence of acute infarct. No hydrocephalus. No extra-axial fluid collection. Age related cerebral atrophy and chronic small vessel ischemic disease. Vascular: No hyperdense vessel. Intracranial arterial calcification. Skull: No fracture or focal lesion. Sinuses/Orbits: No acute finding. Other: None. CT CERVICAL SPINE FINDINGS Alignment: No evidence of traumatic malalignment. Skull base and vertebrae: No acute fracture. No primary bone lesion or focal pathologic process. Soft tissues and spinal canal: No prevertebral fluid or swelling. No visible canal hematoma. Disc levels: Multilevel spondylosis, disc space height loss, and degenerative endplate changes greatest at C3-C4, C4-C5, and C5-C6 where it is moderate. Posterior disc osteophyte complexes cause multilevel effacement of the ventral thecal sac greatest at C3-C4 where it is mild-to-moderate. Upper chest: No acute abnormality. Other: Carotid calcification. IMPRESSION: 1. No acute intracranial abnormality. 2. No acute fracture in the cervical spine. Electronically Signed   By: Rozell Cornet M.D.   On: 09/01/2023 02:25    Micro Results     Recent Results (from the past 240 hours)  Resp panel by RT-PCR (RSV, Flu A&B, Covid) Anterior Nasal Swab     Status: None   Collection Time: 09/01/23  8:33 AM   Specimen:  Anterior Nasal Swab  Result Value Ref Range Status   SARS  Coronavirus 2 by RT PCR NEGATIVE NEGATIVE Final   Influenza A by PCR NEGATIVE NEGATIVE Final   Influenza B by PCR NEGATIVE NEGATIVE Final    Comment: (NOTE) The Xpert Xpress SARS-CoV-2/FLU/RSV plus assay is intended as an aid in the diagnosis of influenza from Nasopharyngeal swab specimens and should not be used as a sole basis for treatment. Nasal washings and aspirates are unacceptable for Xpert Xpress SARS-CoV-2/FLU/RSV testing.  Fact Sheet for Patients: BloggerCourse.com  Fact Sheet for Healthcare Providers: SeriousBroker.it  This test is not yet approved or cleared by the [place]  FDA and has been authorized for detection and/or diagnosis of SARS-CoV-2 by FDA under an Emergency Use Authorization (EUA). This EUA will remain in effect (meaning this test can be used) for the duration of the COVID-19 declaration under Section 564(b)(1) of the Act, 21 U.S.C. section 360bbb-3(b)(1), unless the authorization is terminated or revoked.     Resp Syncytial Virus by PCR NEGATIVE NEGATIVE Final    Comment: (NOTE) Fact Sheet for Patients: BloggerCourse.com  Fact Sheet for Healthcare Providers: SeriousBroker.it  This test is not yet approved or cleared by the [place]  FDA and has been authorized for detection and/or diagnosis of SARS-CoV-2 by FDA under an Emergency Use Authorization (EUA). This EUA will remain in effect (meaning this test can be used) for the duration of the COVID-19 declaration under Section 564(b)(1) of the Act, 21 U.S.C. section 360bbb-3(b)(1), unless the authorization is terminated or revoked.  Performed at [facility], [address], [place], [place] [address]     Today   Subjective    Hunter Sandoval today has no headache,no chest abdominal pain,no new weakness tingling or numbness, feels much better wants to go home today.     Objective    Blood pressure 102/60, pulse 65, temperature 98 F (36.7 C), temperature source Oral, resp. rate 18, height 5\' 9"  (1.753 m), weight 96.2 kg, SpO2 94%.   Intake/Output Summary (Last 24 hours) at 09/04/2023 1021 Last data filed at 09/04/2023 0525 Gross per 24 hour  Intake 480 ml  Output 2250 ml  Net -1770 ml    Exam  Awake Alert, No new F.N deficits,    [place].AT,PERRAL Supple Neck,   Symmetrical Chest wall movement, Good air movement bilaterally, CTAB RRR,No Gallops,   +ve B.Sounds, Abd Soft, Non tender,  No Cyanosis, Clubbing or edema    Data Review   Recent Labs  Lab 09/02/23 0443 09/02/23 1552 09/02/23 2036 09/03/23 0510 09/04/23 0633  WBC 10.1 13.2* 13.2* 10.2 8.7  HGB 8.4* 9.4* 8.5* 7.9* 8.2*  HCT 24.5* 27.9* 25.2* 23.4* 24.1*  PLT 230 302 287 269 307  MCV 84.8 85.1 86.9 85.7 85.5  MCH 29.1 28.7 29.3 28.9 29.1  MCHC 34.3 33.7 33.7 33.8 34.0  RDW 14.6 14.6 14.6 14.4 14.2  LYMPHSABS  --   --   --  1.9 2.0  MONOABS  --   --   --  0.6 0.5  EOSABS  --   --   --  0.3 0.4  BASOSABS  --   --   --  0.1 0.1    Recent Labs  Lab 09/01/23 0201 09/01/23 1025 09/01/23 1652 09/02/23 0443 09/03/23 0510 09/04/23 0633  NA 137 139  --  139 137 139  K 5.7* 3.9  --  4.4 4.3 4.3  CL 113* 117*  --  114* 112* 109  CO2 17* 15*  --  18* 20* 23  ANIONGAP 7 7  --  7 5 7   GLUCOSE 182* 108*  --  102* 104* 102*  BUN 77* 67*  --  62* 35* 26*  CREATININE 2.07* 1.48*  --  1.49* 1.40* 1.50*  AST 32  --   --   --   --   --   ALT 41  --   --   --   --   --   ALKPHOS 43  --   --   --   --   --   BILITOT 0.5  --   --   --   --   --   ALBUMIN 2.0*  --   --   --   --   --   PROCALCITON  --  0.56  --   --   --   --   LATICACIDVEN 1.2  --   --   --   --   --   INR 4.5*  --  1.9* 1.6* 1.5*  --   TSH  --   --   --  5.613*  --   --   BNP  --   --   --  25.3 81.0  --   MG  --   --   --   --  1.8 1.8  CALCIUM  7.1* 6.1*  --  8.0* 8.1* 8.4*    Total Time in preparing paper work, data  evaluation and todays exam - 35 minutes  Signature  -    Lynnwood Sauer M.D on 09/04/2023 at 10:21 AM   -  To page go to [email or web address]

## 2023-09-04 NOTE — Progress Notes (Signed)
 Ok to transition protonix  to PO per Dr. Zelda Hickman.  Ivery Marking, PharmD, BCIDP, AAHIVP, CPP Infectious Disease Pharmacist 09/04/2023 9:31 AM

## 2023-09-04 NOTE — Progress Notes (Signed)
 Physical Therapy Treatment Patient Details Name: Hunter Sandoval MRN: [id number] DOB: 03-24-1951 Today's Date: 09/04/2023   History of Present Illness 73 y.o. male admitted 09/01/23 with lightheadedness, fall hitting R-side head with suspected LOC; bowel incontinence, hypotension en route with EMS. Workup for suspected GIB, hypotension. EGD 5/7. PMH includes HTN, HLD, DVT/PE on anticoagulation, CKD, BPH, obesity.   PT Comments  Pt progressing with mobility. Today's session focused on ambulation and higher level balance testing; pt moving well at supervision-level; asymptomatic with activity. Pt reports hopeful for d/c home today; will have necessary assist from family. Education reviewed, pt reports no further questions or concerns. If to remain admitted, will continue to follow acutely to address established goals.     If plan is discharge home, recommend the following: Assistance with cooking/housework;Assist for transportation   Can travel by private vehicle      Yes  Equipment Recommendations  None recommended by PT    Recommendations for Other Services       Precautions / Restrictions Precautions Precautions: Fall Recall of Precautions/Restrictions: Intact Restrictions Weight Bearing Restrictions Per Provider Order: No     Mobility  Bed Mobility Overal bed mobility: Modified Independent                  Transfers Overall transfer level: Independent Equipment used: None Transfers: Sit to/from Stand                  Ambulation/Gait Ambulation/Gait assistance: Supervision Gait Distance (Feet): 600 Feet Assistive device: None Gait Pattern/deviations: Step-through pattern, Decreased stride length, Drifts right/left Gait velocity: Decreased     General Gait Details: slow, mostly steady gait without DME; stability improved with distance and cues for increased gait speed. pt performing DGI tasks in hallway without overt LOB   Stairs              Wheelchair Mobility     Tilt Bed    Modified Rankin (Stroke Patients Only)       Balance Overall balance assessment: No apparent balance deficits (not formally assessed) Sitting-balance support: No upper extremity supported, Feet supported Sitting balance-Leahy Scale: Good     Standing balance support: No upper extremity supported, During functional activity Standing balance-Leahy Scale: Good                   Standardized Balance Assessment Standardized Balance Assessment : Dynamic Gait Index   Dynamic Gait Index Level Surface: Mild Impairment Change in Gait Speed: Mild Impairment Gait with Horizontal Head Turns: Normal Gait with Vertical Head Turns: Normal Gait and Pivot Turn: Normal Step Over Obstacle: Mild Impairment Step Around Obstacles: Normal Steps: Mild Impairment Total Score: 20      Communication Communication Communication: No apparent difficulties  Cognition Arousal: Alert Behavior During Therapy: WFL for tasks assessed/performed   PT - Cognitive impairments: No apparent impairments                       PT - Cognition Comments: cognition WFL for simple tasks, not formally assessed Following commands: Intact      Cueing    Exercises      General Comments General comments (skin integrity, edema, etc.): post-ambulation BP 134/118 (MAP 125), HR 89 while seated; standing BP 109/72 (MAP 82), HR 100; standing 2-min BP 108/75, HR 113. reviewed educ re: activity recommendations, importance of mobility at home      Pertinent Vitals/Pain Pain Assessment Pain Assessment: No/denies pain Pain Intervention(s): Monitored during  session    Home Living                          Prior Function            PT Goals (current goals can now be found in the care plan section) Progress towards PT goals: Progressing toward goals    Frequency    Min 1X/week      PT Plan      Co-evaluation              AM-PAC PT  "6 Clicks" Mobility   Outcome Measure  Help needed turning from your back to your side while in a flat bed without using bedrails?: None Help needed moving from lying on your back to sitting on the side of a flat bed without using bedrails?: None Help needed moving to and from a bed to a chair (including a wheelchair)?: None Help needed standing up from a chair using your arms (e.g., wheelchair or bedside chair)?: None Help needed to walk in hospital room?: A Little Help needed climbing 3-5 steps with a railing? : A Little 6 Click Score: 22    End of Session Equipment Utilized During Treatment: Gait belt Activity Tolerance: Patient tolerated treatment well Patient left: in chair;with call bell/phone within reach;with chair alarm set Nurse Communication: Mobility status PT Visit Diagnosis: Other abnormalities of gait and mobility (R26.89)     Time: 4098-1191 PT Time Calculation (min) (ACUTE ONLY): 18 min  Charges:    $Gait Training: 8-22 mins PT General Charges $$ ACUTE PT VISIT: 1 Visit                     Blase Bur, PT, DPT Acute Rehabilitation Services  Personal: Secure Chat Rehab Office: [phone]  Albino Hum 09/04/2023, 1:05 PM

## 2023-09-04 NOTE — Plan of Care (Signed)
 Pt has rested quietly throughout the night with no distress noted. Alert and oriented. ON room air. SR on  the monitor. Voids per urinal. No complaints voiced.     Problem: Education: Goal: Knowledge of General Education information will improve Description: Including pain rating scale, medication(s)/side effects and non-pharmacologic comfort measures Outcome: Progressing   Problem: Health Behavior/Discharge Planning: Goal: Ability to manage health-related needs will improve Outcome: Progressing   Problem: Clinical Measurements: Goal: Respiratory complications will improve Outcome: Progressing Goal: Cardiovascular complication will be avoided Outcome: Progressing   Problem: Pain Managment: Goal: General experience of comfort will improve and/or be controlled Outcome: Progressing

## 2023-09-07 NOTE — Care Management Important Message (Signed)
 Important Message  Patient Details  Name: Hunter Sandoval MRN: [id number] Date of Birth: Jan 13, 1951   Important Message Given:  Yes - Medicare IM  Patient left prior to IM delivery will mail a copy of the IM to the patient home address.     Devereaux Grayson 09/07/2023, 9:52 AM

## 2023-09-10 DIAGNOSIS — Z7901 Long term (current) use of anticoagulants: Secondary | ICD-10-CM | POA: Diagnosis not present

## 2023-09-10 DIAGNOSIS — Z09 Encounter for follow-up examination after completed treatment for conditions other than malignant neoplasm: Secondary | ICD-10-CM | POA: Diagnosis not present

## 2023-09-10 DIAGNOSIS — K922 Gastrointestinal hemorrhage, unspecified: Secondary | ICD-10-CM | POA: Diagnosis not present

## 2023-09-10 DIAGNOSIS — W19XXXA Unspecified fall, initial encounter: Secondary | ICD-10-CM | POA: Diagnosis not present

## 2023-09-18 ENCOUNTER — Other Ambulatory Visit: Payer: Self-pay | Admitting: Urology

## 2023-09-18 DIAGNOSIS — R972 Elevated prostate specific antigen [PSA]: Secondary | ICD-10-CM

## 2023-09-28 NOTE — Progress Notes (Signed)
 09/29/2023 Hunter Sandoval [id number] July 04, 1950  Referring provider: Pete Brand, DO Primary GI doctor: Dr. Karene Oto (Dr. Sandrea Cruel)  ASSESSMENT AND PLAN:  UGIB secondary to supratheraputic INR and gastric ulcers  EGD 09/02/2023 - LA Grade B reflux esophagitis with no bleeding. - Normal upper third of esophagus and middle third of esophagus. - Gastritis. Biopsied. - Non- bleeding gastric ulcer with pigmented material. Clips ( MR conditional) were placed. Clip manufacturer: [employer]. Injected. - Normal examined duodenum. Negative H pylori gastritis No melena, no dysphagia, has improved, no AB pain -Has stopped NSAIDS since the hospital visit, counseled to continue to avoid -Has been on protonix  40 mg BID, needs refill, will continue twice a day for another month and then down to once a day, can consider stopping pending EGD -Will schedule for EGD at [facility] to evaluate for healing, will need to hold eliquis  for 2 days, I discussed risks of EGD with patient today, including risk of sedation, bleeding or perforation.  Patient provides understanding and gave verbal consent to proceed.  Anemia 09/04/2023  HGB 8.2 MCV 85.5 Platelets 307 Recent Labs    09/01/23 0200 09/01/23 0201 09/01/23 1025 09/01/23 1652 09/01/23 2008 09/02/23 0443 09/02/23 1552 09/02/23 2036 09/03/23 0510 09/04/23 0633  HGB 7.1* 7.9* 8.5* 9.0* 8.4* 8.4* 9.4* 8.5* 7.9* 8.2*  Has had some DOE with exertion, no chest pain Not on oral iron, no IV iron - request labs from PCP, consider checking iron/ferritin - consider IV iron  Personal history of colon polyps colonoscopy in May 2021 Dr. Sandrea Cruel 2 polyps were removed largest 9 mm and was noted to have multiple diverticuli throughout the left colon and internal hemorrhoids.  Path on the polyps showed 1 of these to be a tubular adenoma and the other was a leiomyoma.   history of PE  Now on elqiuis 5 mg BID, off coumadin Hold Eliquis  for 2 days before procedure  will instruct when and how to resume after procedure.  Patient understands that there is a low but real risk of cardiovascular event such as heart attack, stroke, or embolism /  thrombosis, or ischemia while off Eliquis   The patient consents to proceed.  Will communicate by phone or EMR with patient's prescribing provider to confirm that holding Eliquis  is reasonable in this case.   CKD BUN 26 Cr 1.50  GFR 50    Patient Care Team: Collins, Dana, DO as PCP - General (Family Medicine)  HISTORY OF PRESENT ILLNESS: 73 y.o. male with a past medical history listed below presents for evaluation of UGI bleed.   Discussed the use of AI scribe software for clinical note transcription with the patient, who gave verbal consent to proceed.  History of Present Illness   Hunter Sandoval is a 73 year old male who presents with a history of gastrointestinal bleeding and recent hospitalization.  Approximately one month ago, he was hospitalized due to elevated INR levels and experienced melena, indicative of gastrointestinal bleeding. During the hospitalization, an endoscopy was performed on May 7th, revealing grade B esophagitis, gastritis, and gastric ulcers. A clip and injection were applied to the bleeding ulcer. The duodenum appeared normal.  He was previously on warfarin but has since been switched to Eliquis . He continues to take pantoprazole  (Protonix ) 40 mg twice daily and requires a prescription refill as his previous supply was filled at the hospital. No current dark stools, blood in the stool, or trouble swallowing. He notes significant improvement in swallowing and no  longer experiences acid reflux. No abdominal pain, stating he never had pain even with the ulcer.  He experiences fatigue and needs to rest frequently during activities. His red blood cell count has improved since his hospitalization. He denies the use of over-the-counter anti-inflammatories such as Aleve, ibuprofen, or Goody Powders.  He does not consume alcohol regularly but enjoys an occasional glass of wine. He avoids soda and is mindful of dietary choices that could exacerbate his condition.        He  reports that he has been smoking cigars. He has never used smokeless tobacco. He reports current alcohol use. He reports that he does not use drugs.  RELEVANT GI HISTORY, IMAGING AND LABS: Results   LABS INR: elevated (08/2023) PSA: significantly elevated (08/2023)  DIAGNOSTIC Endoscopy: Esophagitis grade B, gastritis, gastric ulcers with bleeding, normal duodenum (09/02/2023)      CBC    Component Value Date/Time   WBC 8.7 09/04/2023 0633   RBC 2.82 (L) 09/04/2023 0633   HGB 8.2 (L) 09/04/2023 0633   HCT 24.1 (L) 09/04/2023 0633   PLT 307 09/04/2023 0633   MCV 85.5 09/04/2023 0633   MCH 29.1 09/04/2023 0633   MCHC 34.0 09/04/2023 0633   RDW 14.2 09/04/2023 0633   LYMPHSABS 2.0 09/04/2023 0633   MONOABS 0.5 09/04/2023 0633   EOSABS 0.4 09/04/2023 0633   BASOSABS 0.1 09/04/2023 0633   Recent Labs    09/01/23 0200 09/01/23 0201 09/01/23 1025 09/01/23 1652 09/01/23 2008 09/02/23 0443 09/02/23 1552 09/02/23 2036 09/03/23 0510 09/04/23 0633  HGB 7.1* 7.9* 8.5* 9.0* 8.4* 8.4* 9.4* 8.5* 7.9* 8.2*    CMP     Component Value Date/Time   NA 139 09/04/2023 0633   K 4.3 09/04/2023 0633   CL 109 09/04/2023 0633   CO2 23 09/04/2023 0633   GLUCOSE 102 (H) 09/04/2023 0633   BUN 26 (H) 09/04/2023 0633   CREATININE 1.50 (H) 09/04/2023 0633   CALCIUM  8.4 (L) 09/04/2023 0633   PROT 4.4 (L) 09/01/2023 0201   ALBUMIN 2.0 (L) 09/01/2023 0201   AST 32 09/01/2023 0201   ALT 41 09/01/2023 0201   ALKPHOS 43 09/01/2023 0201   BILITOT 0.5 09/01/2023 0201   GFRNONAA 50 (L) 09/04/2023 0633   GFRAA (L) 10/26/2009 0540    55        The eGFR has been calculated using the MDRD equation. This calculation has not been validated in all clinical situations. eGFR's persistently <60 mL/min signify possible  Chronic Kidney Disease.      Latest Ref Rng & Units 09/01/2023    2:01 AM 11/22/2007    8:49 AM  Hepatic Function  Total Protein 6.5 - 8.1 g/dL 4.4  7.3   Albumin 3.5 - 5.0 g/dL 2.0  4.0   AST 15 - 41 U/L 32  21   ALT 0 - 44 U/L 41  20   Alk Phosphatase 38 - 126 U/L 43  34   Total Bilirubin 0.0 - 1.2 mg/dL 0.5  1.1   Bilirubin, Direct 0.0 - 0.3 mg/dL  0.1       Current Medications:    Current Outpatient Medications (Cardiovascular):    amLODipine  (NORVASC ) 2.5 MG tablet, Take 1 tablet (2.5 mg total) by mouth at bedtime.    Current Outpatient Medications (Hematological):    apixaban  (ELIQUIS ) 5 MG TABS tablet, Take 1 tablet (5 mg total) by mouth 2 (two) times daily.  Current Outpatient Medications (Other):    Ascorbic Acid (  VITAMIN C) 1000 MG tablet, Take 1,000 mg by mouth daily.   B Complex Vitamins (B COMPLEX PO), Take 1 tablet by mouth daily.   Cholecalciferol (VITAMIN D3) 50 MCG (2000 UT) capsule, Take 2,000 Units by mouth daily.   MAGNESIUM PO, Take 1 tablet by mouth daily.   pantoprazole  (PROTONIX ) 40 MG tablet, Take 1 tablet (40 mg total) by mouth 2 (two) times daily.  Medical History:  Past Medical History:  Diagnosis Date   Adenomatous polyp of colon 10/2004   BPH (benign prostatic hyperplasia)    Chronic kidney disease, stage 3 (HCC)    Deep vein thrombosis (DVT) (HCC)    Diverticulosis    Hypertension    Hypothyroidism    Pulmonary embolism (HCC)    Pure hypercholesterolemia    Vitamin D deficiency    Allergies: No Known Allergies   Surgical History:  He  has a past surgical history that includes No past surgeries and Esophagogastroduodenoscopy (Left, 09/02/2023). Family History:  His family history includes Diabetes in his paternal grandmother; Heart attack in his brother; Heart disease in his maternal grandfather and paternal grandfather; Hypertension in his father; Stroke in his father.  REVIEW OF SYSTEMS  : All other systems reviewed and negative  except where noted in the History of Present Illness.  PHYSICAL EXAM: BP 130/88 (BP Location: Left Arm, Patient Position: Sitting, Cuff Size: Normal)   Pulse 72   Ht 5\' 9"  (1.753 m) Comment: height measured without shoes  Wt 208 lb (94.3 kg)   BMI 30.72 kg/m  Physical Exam   GENERAL APPEARANCE: Well nourished, in no apparent distress. HEENT: No cervical lymphadenopathy, unremarkable thyroid , sclerae anicteric, conjunctiva pink. RESPIRATORY: Respiratory effort normal, breath sounds clear to auscultation bilaterally without rales, rhonchi, or wheezing. CARDIO: Regular rate and rhythm with no murmurs, rubs, or gallops, peripheral pulses intact. ABDOMEN: Soft, non-distended, active bowel sounds in all four quadrants, no tenderness to palpation, no rebound, no mass appreciated, umbilical hernia noted, non-tender. RECTAL: Declines. MUSCULOSKELETAL: Full range of motion, normal gait, without edema. SKIN: Dry, intact without rashes or lesions. No jaundice. NEURO: Alert, oriented, no focal deficits. PSYCH: Cooperative, normal mood and affect.      Edmonia Gottron, PA-C 11:42 AM

## 2023-09-29 ENCOUNTER — Telehealth: Payer: Self-pay | Admitting: *Deleted

## 2023-09-29 ENCOUNTER — Ambulatory Visit: Admitting: Physician Assistant

## 2023-09-29 ENCOUNTER — Encounter: Payer: Self-pay | Admitting: Physician Assistant

## 2023-09-29 ENCOUNTER — Encounter: Payer: Self-pay | Admitting: *Deleted

## 2023-09-29 VITALS — BP 130/88 | HR 72 | Ht 69.0 in | Wt 208.0 lb

## 2023-09-29 DIAGNOSIS — K922 Gastrointestinal hemorrhage, unspecified: Secondary | ICD-10-CM

## 2023-09-29 DIAGNOSIS — D649 Anemia, unspecified: Secondary | ICD-10-CM

## 2023-09-29 DIAGNOSIS — Z86711 Personal history of pulmonary embolism: Secondary | ICD-10-CM | POA: Diagnosis not present

## 2023-09-29 DIAGNOSIS — Z860101 Personal history of adenomatous and serrated colon polyps: Secondary | ICD-10-CM | POA: Diagnosis not present

## 2023-09-29 MED ORDER — PANTOPRAZOLE SODIUM 40 MG PO TBEC: 40.0000 mg | DELAYED_RELEASE_TABLET | Freq: Two times a day (BID) | ORAL | 2 refills | Status: DC

## 2023-09-29 NOTE — Patient Instructions (Addendum)
 Please take your proton pump inhibitor medication, pantoprazole  40 mg TWICE a day 30 mins-one hour before food for another month, then go down to once a day  Avoid spicy and acidic foods Avoid fatty foods Limit your intake of coffee, tea, alcohol, and carbonated drinks Work to maintain a healthy weight Keep the head of the bed elevated at least 3 inches with blocks or a wedge pillow if you are having any nighttime symptoms Stay upright for 2 hours after eating Avoid meals and snacks three to four hours before bedtime   Reflux Gourmet Rescue  It is an ALGINATE THERAPY which is the only intervention that works to safeguard the esophagus by creating a protective barrier that actually stops reflux from happening. -The general directions for use are as stated on the packaging: Take 1 teaspoon (5 ml), or more as needed or as directed by your physician, after meals and before bed. -These general directions address the most common times for reflux to occur, but our Rescue products may be taken anytime. Some individuals may take our product preemptively, when they know they will suffer from reflux, or as needed - when discomfort arises. (If taken around food, it should be consumed last.) -You do not have to take 1 teaspoon (5 ml) of the product. While one teaspoon (5ml) may be the perfect average amount to relieve reflux suffering in some, others may require more or less. You may adjust the amount of Mint Chocolate Rescue and Vanilla Caramel Rescue to the lowest amount necessary to meet your individual needs to improve your quality of life. -You may dilute the product if it is too viscous for you to consume. Keep in mind, however, that the thickness of the product was formulated to provide optimal coating and protection of your throat and esophagus. Though diluting the product is possible, it may reduce the protective function and/or length of action. -This can be used in conjunction with reflux medications  and lifestyle changes.  100% ALL-NATURAL  Paraben FREE, glycerin FREE, & potassium FREE  Made entirely from all-natural ingredients considered safe for children and during pregnancy  No known side effects  All-natural flavor Gluten FREE  Allergen FREE  Vegan  Can find more information here: NameSeizer.co.nz   Due to recent changes in healthcare laws, you may see the results of your imaging and laboratory studies on MyChart before your provider has had a chance to review them.  We understand that in some cases there may be results that are confusing or concerning to you. Not all laboratory results come back in the same time frame and the provider may be waiting for multiple results in order to interpret others.  Please give us  48 hours in order for your provider to thoroughly review all the results before contacting the office for clarification of your results.

## 2023-09-29 NOTE — Telephone Encounter (Signed)
  Hunter Sandoval 15-May-1950 [id number]  09/29/2023   Dear Bernabe Brew Collins,DO:  We have scheduled the above named patient for a(n) Endoscopy procedure. Our records show that he is on anticoagulation therapy.  Please advise as to whether the patient may come off their therapy of Eliquis  2 days prior to their procedure which is scheduled for 11/02/2023.  Please route your response to Brena Calk or fax response to [phone].  Sincerely,    [facility]    Faxed to Pete Brand through Epic and faxed from here

## 2023-10-05 NOTE — Telephone Encounter (Signed)
 Received fax written ok to hold Eliquis  2 days before procedure, Left message

## 2023-10-13 ENCOUNTER — Ambulatory Visit: Admitting: Podiatry

## 2023-10-13 ENCOUNTER — Encounter: Payer: Self-pay | Admitting: Podiatry

## 2023-10-13 VITALS — Ht 69.0 in | Wt 208.0 lb

## 2023-10-13 DIAGNOSIS — M79674 Pain in right toe(s): Secondary | ICD-10-CM | POA: Diagnosis not present

## 2023-10-13 DIAGNOSIS — M79675 Pain in left toe(s): Secondary | ICD-10-CM | POA: Diagnosis not present

## 2023-10-13 DIAGNOSIS — B351 Tinea unguium: Secondary | ICD-10-CM | POA: Diagnosis not present

## 2023-10-14 ENCOUNTER — Encounter: Payer: Self-pay | Admitting: Podiatry

## 2023-10-14 NOTE — Progress Notes (Signed)
  Subjective:  Patient ID: Hunter Sandoval, male    DOB: 16-Nov-1950,  MRN: [id number]  Chief Complaint  Patient presents with   Nail Problem    RFC,Pain due to onychomycosis of toenails of both feet    73 y.o. male returns for follow-up with the above complaint. History confirmed with patient. Previous debridements have been helpful in reducing pain and improving his ambulation and function.  No other new issues  Objective:  Physical Exam: warm, good capillary refill, bunions, no trophic changes or ulcerative lesions, normal DP and PT pulses, normal sensory exam. Onychomycosis x10 with yellow discoloration and thickening of the nail plates. Assessment:   1. Pain due to onychomycosis of toenails of both feet        Plan:  Patient was evaluated and treated and all questions answered.  Discussed the etiology and treatment options for the condition in detail with the patient.  Previous debridements helpful so far. Recommended debridement of the nails today. Sharp and mechanical debridement performed of all painful and mycotic nails today. Nails debrided in length and thickness using a nail nipper and a mechanical burr to level of comfort. Discussed treatment options including appropriate shoe gear. Follow up in 3 months for painful thickened mycotic nails     Return in about 3 months (around 01/13/2024) for painful thick fungal nails.

## 2023-10-15 NOTE — Telephone Encounter (Signed)
 Called patient back to make sure that he received my message about holding his Eliquis  2 days. He is aware

## 2023-10-23 ENCOUNTER — Encounter: Payer: Self-pay | Admitting: Gastroenterology

## 2023-10-29 DIAGNOSIS — L814 Other melanin hyperpigmentation: Secondary | ICD-10-CM | POA: Diagnosis not present

## 2023-10-29 DIAGNOSIS — L905 Scar conditions and fibrosis of skin: Secondary | ICD-10-CM | POA: Diagnosis not present

## 2023-10-29 DIAGNOSIS — Z08 Encounter for follow-up examination after completed treatment for malignant neoplasm: Secondary | ICD-10-CM | POA: Diagnosis not present

## 2023-10-29 DIAGNOSIS — L570 Actinic keratosis: Secondary | ICD-10-CM | POA: Diagnosis not present

## 2023-10-29 DIAGNOSIS — L821 Other seborrheic keratosis: Secondary | ICD-10-CM | POA: Diagnosis not present

## 2023-10-29 DIAGNOSIS — D225 Melanocytic nevi of trunk: Secondary | ICD-10-CM | POA: Diagnosis not present

## 2023-10-29 DIAGNOSIS — Z85828 Personal history of other malignant neoplasm of skin: Secondary | ICD-10-CM | POA: Diagnosis not present

## 2023-11-02 ENCOUNTER — Other Ambulatory Visit (INDEPENDENT_AMBULATORY_CARE_PROVIDER_SITE_OTHER)

## 2023-11-02 ENCOUNTER — Ambulatory Visit: Admitting: Gastroenterology

## 2023-11-02 ENCOUNTER — Encounter: Payer: Self-pay | Admitting: Gastroenterology

## 2023-11-02 VITALS — BP 120/76 | HR 60 | Temp 97.0°F | Resp 17 | Ht 69.0 in | Wt 208.0 lb

## 2023-11-02 DIAGNOSIS — K2970 Gastritis, unspecified, without bleeding: Secondary | ICD-10-CM | POA: Diagnosis not present

## 2023-11-02 DIAGNOSIS — K31A0 Gastric intestinal metaplasia, unspecified: Secondary | ICD-10-CM

## 2023-11-02 DIAGNOSIS — K922 Gastrointestinal hemorrhage, unspecified: Secondary | ICD-10-CM

## 2023-11-02 DIAGNOSIS — N183 Chronic kidney disease, stage 3 unspecified: Secondary | ICD-10-CM | POA: Diagnosis not present

## 2023-11-02 DIAGNOSIS — K2981 Duodenitis with bleeding: Secondary | ICD-10-CM | POA: Diagnosis not present

## 2023-11-02 DIAGNOSIS — K219 Gastro-esophageal reflux disease without esophagitis: Secondary | ICD-10-CM

## 2023-11-02 DIAGNOSIS — K319 Disease of stomach and duodenum, unspecified: Secondary | ICD-10-CM | POA: Diagnosis not present

## 2023-11-02 DIAGNOSIS — K2980 Duodenitis without bleeding: Secondary | ICD-10-CM | POA: Diagnosis not present

## 2023-11-02 DIAGNOSIS — E039 Hypothyroidism, unspecified: Secondary | ICD-10-CM | POA: Diagnosis not present

## 2023-11-02 DIAGNOSIS — K3189 Other diseases of stomach and duodenum: Secondary | ICD-10-CM | POA: Diagnosis not present

## 2023-11-02 LAB — IBC + FERRITIN
Ferritin: 10.1 ng/mL — ABNORMAL LOW (ref 22.0–322.0)
Iron: 42 ug/dL (ref 42–165)
Saturation Ratios: 10.7 % — ABNORMAL LOW (ref 20.0–50.0)
TIBC: 393.4 ug/dL (ref 250.0–450.0)
Transferrin: 281 mg/dL (ref 212.0–360.0)

## 2023-11-02 LAB — CBC WITH DIFFERENTIAL/PLATELET
Basophils Absolute: 0 K/uL (ref 0.0–0.1)
Basophils Relative: 0.8 % (ref 0.0–3.0)
Eosinophils Absolute: 0.2 K/uL (ref 0.0–0.7)
Eosinophils Relative: 3.2 % (ref 0.0–5.0)
HCT: 34.6 % — ABNORMAL LOW (ref 39.0–52.0)
Hemoglobin: 11.6 g/dL — ABNORMAL LOW (ref 13.0–17.0)
Lymphocytes Relative: 19.9 % (ref 12.0–46.0)
Lymphs Abs: 1.2 K/uL (ref 0.7–4.0)
MCHC: 33.4 g/dL (ref 30.0–36.0)
MCV: 82.2 fl (ref 78.0–100.0)
Monocytes Absolute: 0.5 K/uL (ref 0.1–1.0)
Monocytes Relative: 9.4 % (ref 3.0–12.0)
Neutro Abs: 3.9 K/uL (ref 1.4–7.7)
Neutrophils Relative %: 66.7 % (ref 43.0–77.0)
Platelets: 225 K/uL (ref 150.0–400.0)
RBC: 4.21 Mil/uL — ABNORMAL LOW (ref 4.22–5.81)
RDW: 14 % (ref 11.5–15.5)
WBC: 5.8 K/uL (ref 4.0–10.5)

## 2023-11-02 MED ORDER — SODIUM CHLORIDE 0.9 % IV SOLN: 500.0000 mL | Freq: Once | INTRAVENOUS | Status: DC

## 2023-11-02 NOTE — Patient Instructions (Addendum)
 Thank you for letting us  care for your healthcare needs today! Resume previous diet. No changes to medications. Resume Eliquis  tomorrow (11/03/23) at prior dose. CBC and iron panel labs ordered.  YOU HAD AN ENDOSCOPIC PROCEDURE TODAY AT THE  ENDOSCOPY CENTER:   Refer to the procedure report that was given to you for any specific questions about what was found during the examination.  If the procedure report does not answer your questions, please call your gastroenterologist to clarify.  If you requested that your care partner not be given the details of your procedure findings, then the procedure report has been included in a sealed envelope for you to review at your convenience later.  YOU SHOULD EXPECT: Some feelings of bloating in the abdomen. Passage of more gas than usual.  Walking can help get rid of the air that was put into your GI tract during the procedure and reduce the bloating. If you had a lower endoscopy (such as a colonoscopy or flexible sigmoidoscopy) you may notice spotting of blood in your stool or on the toilet paper. If you underwent a bowel prep for your procedure, you may not have a normal bowel movement for a few days.  Please Note:  You might notice some irritation and congestion in your nose or some drainage.  This is from the oxygen used during your procedure.  There is no need for concern and it should clear up in a day or so.  SYMPTOMS TO REPORT IMMEDIATELY:  Following upper endoscopy (EGD)  Vomiting of blood or coffee ground material  New chest pain or pain under the shoulder blades  Painful or persistently difficult swallowing  New shortness of breath  Fever of 100F or higher  Black, tarry-looking stools  For urgent or emergent issues, a gastroenterologist can be reached at any hour by calling [phone]. Do not use MyChart messaging for urgent concerns.    DIET:  We do recommend a small meal at first, but then you may proceed to your regular diet.   Drink plenty of fluids but you should avoid alcoholic beverages for 24 hours.  ACTIVITY:  You should plan to take it easy for the rest of today and you should NOT DRIVE or use heavy machinery until tomorrow (because of the sedation medicines used during the test).    FOLLOW UP: Our staff will call the number listed on your records the next business day following your procedure.  We will call around 7:15- 8:00 am to check on you and address any questions or concerns that you may have regarding the information given to you following your procedure. If we do not reach you, we will leave a message.     If any biopsies were taken you will be contacted by phone or by letter within the next 1-3 weeks.  Please call us  at [phone] if you have not heard about the biopsies in 3 weeks.    SIGNATURES/CONFIDENTIALITY: You and/or your care partner have signed paperwork which will be entered into your electronic medical record.  These signatures attest to the fact that that the information above on your After Visit Summary has been reviewed and is understood.  Full responsibility of the confidentiality of this discharge information lies with you and/or your care-partner.

## 2023-11-02 NOTE — Progress Notes (Signed)
 Called to room to assist during endoscopic procedure.  Patient ID and intended procedure confirmed with present staff. Received instructions for my participation in the procedure from the performing physician.

## 2023-11-02 NOTE — Progress Notes (Signed)
 GASTROENTEROLOGY PROCEDURE H&P NOTE   Primary Care Physician: Gerome Brunet, DO    Reason for Procedure:   Gastric ulcer, gastritis, Erosive esophagitis  Plan:    EGD  Patient is appropriate for endoscopic procedure(s) in the ambulatory ([facility]) setting.  The nature of the procedure, as well as the risks, benefits, and alternatives were carefully and thoroughly reviewed with the patient. Ample time for discussion and questions allowed. The patient understood, was satisfied, and agreed to proceed.     HPI: Hunter Sandoval is a 73 y.o. male who presents for EGD to evaluate for appropriate healing of gastric ulcer, gastritis and erosive esophagitis. Was admitted in 08/2023 with UGIB in the sertting of supratherapeutic INR. Has since been changed to Eliquis  and no overt bleeding. Holdign x2 days. Was seen in the GI clinic on 09/29/2023. Completed high dose PPI, and now on Protonix  40 mg daily. No signifiicant changes in clinical status since that appt. No new labs and no recent abdominal imaging for review.   Past Medical History:  Diagnosis Date   Adenomatous polyp of colon 10/2004   BPH (benign prostatic hyperplasia)    Chronic kidney disease, stage 3 (HCC)    Deep vein thrombosis (DVT) (HCC)    Diverticulosis    Hypertension    Hypothyroidism    Pulmonary embolism (HCC)    Pure hypercholesterolemia    Vitamin D deficiency     Past Surgical History:  Procedure Laterality Date   ESOPHAGOGASTRODUODENOSCOPY Left 09/02/2023   Procedure: EGD (ESOPHAGOGASTRODUODENOSCOPY);  Surgeon: San Sandor GAILS, DO;  Location: [facility] ENDOSCOPY;  Service: Gastroenterology;  Laterality: Left;   NO PAST SURGERIES      Prior to Admission medications   Medication Sig Start Date End Date Taking? Authorizing Provider  amLODipine  (NORVASC ) 2.5 MG tablet Take 1 tablet (2.5 mg total) by mouth at bedtime. 09/05/23   Singh, Prashant K, MD  apixaban  (ELIQUIS ) 5 MG TABS tablet Take 1 tablet (5 mg total) by mouth 2  (two) times daily. 09/06/23   Singh, Prashant K, MD  Ascorbic Acid (VITAMIN C) 1000 MG tablet Take 1,000 mg by mouth daily.    [provider]  B Complex Vitamins (B COMPLEX PO) Take 1 tablet by mouth daily.    [provider]  Cholecalciferol (VITAMIN D3) 50 MCG (2000 UT) capsule Take 2,000 Units by mouth daily.    [provider]  MAGNESIUM PO Take 1 tablet by mouth daily.    [provider]  pantoprazole  (PROTONIX ) 40 MG tablet Take 1 tablet (40 mg total) by mouth 2 (two) times daily. 09/29/23   Craig Alan JONELLE, PA-C    Current Outpatient Medications  Medication Sig Dispense Refill   amLODipine  (NORVASC ) 2.5 MG tablet Take 1 tablet (2.5 mg total) by mouth at bedtime.     apixaban  (ELIQUIS ) 5 MG TABS tablet Take 1 tablet (5 mg total) by mouth 2 (two) times daily. 60 tablet 0   Ascorbic Acid (VITAMIN C) 1000 MG tablet Take 1,000 mg by mouth daily.     B Complex Vitamins (B COMPLEX PO) Take 1 tablet by mouth daily.     Cholecalciferol (VITAMIN D3) 50 MCG (2000 UT) capsule Take 2,000 Units by mouth daily.     MAGNESIUM PO Take 1 tablet by mouth daily.     pantoprazole  (PROTONIX ) 40 MG tablet Take 1 tablet (40 mg total) by mouth 2 (two) times daily. 60 tablet 2   No current facility-administered medications for this visit.  Allergies as of 11/02/2023   (No Known Allergies)    Family History  Problem Relation Age of Onset   Stroke Father    Hypertension Father    Heart attack Brother    Heart disease Maternal Grandfather    Diabetes Paternal Grandmother    Heart disease Paternal Grandfather     Social History   Socioeconomic History   Marital status: Married    Spouse name: Not on file   Number of children: 2   Years of education: Not on file   Highest education level: Not on file  Occupational History   Occupation: retired  Tobacco Use   Smoking status: Some Days    Types: Cigars   Smokeless tobacco: Never   Tobacco comments:     Occasionally smoke a cigar  Vaping Use   Vaping status: Never Used  Substance and Sexual Activity   Alcohol use: Yes    Comment: 1 per day - occasional beer   Drug use: Never   Sexual activity: Not on file  Other Topics Concern   Not on file  Social History Narrative   Not on file   Social Drivers of Health   Financial Resource Strain: Not on file  Food Insecurity: No Food Insecurity (09/01/2023)   Hunger Vital Sign    Worried About Running Out of Food in the Last Year: Never true    Ran Out of Food in the Last Year: Never true  Transportation Needs: No Transportation Needs (09/01/2023)   PRAPARE - Administrator, Civil Service (Medical): No    Lack of Transportation (Non-Medical): No  Physical Activity: Not on file  Stress: Not on file  Social Connections: Moderately Integrated (09/01/2023)   Social Connection and Isolation Panel    Frequency of Communication with Friends and Family: Once a week    Frequency of Social Gatherings with Friends and Family: Once a week    Attends Religious Services: 1 to 4 times per year    Active Member of [employer] or Organizations: Yes    Attends Banker Meetings: 1 to 4 times per year    Marital Status: Married  Catering manager Violence: Not At Risk (09/01/2023)   Humiliation, Afraid, Rape, and Kick questionnaire    Fear of Current or Ex-Partner: No    Emotionally Abused: No    Physically Abused: No    Sexually Abused: No    Physical Exam: Vital signs in last 24 hours: @There  were no vitals taken for this visit. GEN: NAD EYE: Sclerae anicteric ENT: MMM CV: Non-tachycardic Pulm: CTA b/l GI: Soft, NT/ND NEURO:  Alert & Oriented x 3   Sandor Flatter, DO [facility]   11/02/2023 2:36 PM

## 2023-11-02 NOTE — Progress Notes (Signed)
 Agree with the assessment and plan as outlined by Quentin Mulling, PA-C. ? ?Keron Neenan, DO, FACG ? ?

## 2023-11-02 NOTE — Progress Notes (Signed)
 Vss nad trans to pacu

## 2023-11-02 NOTE — Op Note (Signed)
 [facility] Patient Name: Hunter Sandoval Procedure Date: 11/02/2023 2:29 PM MRN: [id number] Endoscopist: Sandor Flatter , MD, [id number] Age: 73 Referring MD:  Date of Birth: [birth year] Gender: Male Account #: [id number] Procedure:                Upper GI endoscopy Indications:              Follow-up of reflux esophagitis, Follow-up of                            gastric ulcer                           Hospital admission in 08/2023 with melena,                            symptomatic anemia, supratherapeutic INR levels,                            due to upper GI bleed. Inpatient EGD on 09/02/2023                            notable for LA Grade B esophagitis, gastritis,                            ulcer in the gastirc fundus (treated with clips x3,                            epinephrine ). Received 2U pRBCs, treated with high                            dose PPI, and changed Coumadin to Eliquis . Medicines:                Monitored Anesthesia Care Procedure:                Pre-Anesthesia Assessment:                           - Prior to the procedure, a History and Physical                            was performed, and patient medications and                            allergies were reviewed. The patient's tolerance of                            previous anesthesia was also reviewed. The risks                            and benefits of the procedure and the sedation                            options and risks were discussed with the patient.  All questions were answered, and informed consent                            was obtained. Prior Anticoagulants: The patient has                            taken Eliquis  (apixaban ), last dose was 2 days                            prior to procedure. ASA Grade Assessment: III - A                            patient with severe systemic disease. After                            reviewing the risks and benefits, the patient  was                            deemed in satisfactory condition to undergo the                            procedure.                           After obtaining informed consent, the endoscope was                            passed under direct vision. Throughout the                            procedure, the patient's blood pressure, pulse, and                            oxygen saturations were monitored continuously. The                            Endoscope was introduced through the mouth, and                            advanced to the third part of duodenum. The upper                            GI endoscopy was accomplished without difficulty.                            The patient tolerated the procedure well. Scope In: Scope Out: Findings:                 There was a small, benign gastric inlet patch in                            the upper esophagus. The remainder of the esophagus  was normal. The previously-noted erosive                            esophagitis has since healed.                           The Z-line was regular and was found 41 cm from the                            incisors.                           Mild inflammation characterized by congestion                            (edema) and erythema was found in the gastric                            fundus, in the gastric body and in the gastric                            antrum. This was much improved compared with the                            previous study. Biopsies were taken with a cold                            forceps for Helicobacter pylori testing. Estimated                            blood loss was minimal.                           An endoclip was found in the gastric fundus. The                            surrounding mucosa was normal appearing. No                            residual ulcer.                           Lymphangiectasia x2 was present in the second                             portion of the duodenum. Biopsies were taken with a                            cold forceps for histology. Estimated blood loss                            was minimal.                           The second portion of the duodenum and  third                            portion of the duodenum were normal. Biopsies were                            taken with a cold forceps for histology. Estimated                            blood loss was minimal. Complications:            No immediate complications. Estimated Blood Loss:     Estimated blood loss was minimal. Impression:               - Normal esophagus.                           - Z-line regular, 41 cm from the incisors.                           - Gastritis. Biopsied.                           - An endoclip was found in the stomach.                           - Duodenal mucosal lymphangiectasia.                           - Normal second portion of the duodenum and third                            portion of the duodenum. Biopsied. Recommendation:           - Patient has a contact number available for                            emergencies. The signs and symptoms of potential                            delayed complications were discussed with the                            patient. Return to normal activities tomorrow.                            Written discharge instructions were provided to the                            patient.                           - Resume previous diet.                           - Continue present medications.                           -  Await pathology results.                           - Resume Eliquis  (apixaban ) at prior dose tomorrow.                           - CBC and iron panel check with next set of labs. Sandor Flatter, MD 11/02/2023 3:17:40 PM

## 2023-11-03 ENCOUNTER — Telehealth: Payer: Self-pay | Admitting: *Deleted

## 2023-11-03 ENCOUNTER — Ambulatory Visit: Payer: Self-pay | Admitting: Gastroenterology

## 2023-11-03 NOTE — Telephone Encounter (Signed)
  Follow up Call-     11/02/2023    2:44 PM  Call back number  Post procedure Call Back phone  # [phone]  Permission to leave phone message Yes     Patient questions:  Message left to call us  if necessary.

## 2023-11-04 ENCOUNTER — Ambulatory Visit
Admission: RE | Admit: 2023-11-04 | Discharge: 2023-11-04 | Disposition: A | Discharge status: Home / Self Care | Source: Ambulatory Visit | Attending: Urology | Admitting: Urology

## 2023-11-04 DIAGNOSIS — R972 Elevated prostate specific antigen [PSA]: Secondary | ICD-10-CM

## 2023-11-04 MED ORDER — GADOPICLENOL 0.5 MMOL/ML IV SOLN
9.0000 mL | Freq: Once | INTRAVENOUS | Status: AC | PRN
  Administered 2023-11-04: 9 mL via INTRAVENOUS

## 2023-11-05 LAB — SURGICAL PATHOLOGY

## 2023-11-09 ENCOUNTER — Other Ambulatory Visit: Payer: Self-pay

## 2023-11-09 DIAGNOSIS — D509 Iron deficiency anemia, unspecified: Secondary | ICD-10-CM

## 2023-11-09 DIAGNOSIS — K922 Gastrointestinal hemorrhage, unspecified: Secondary | ICD-10-CM

## 2023-11-10 ENCOUNTER — Other Ambulatory Visit (INDEPENDENT_AMBULATORY_CARE_PROVIDER_SITE_OTHER)

## 2023-11-10 DIAGNOSIS — D509 Iron deficiency anemia, unspecified: Secondary | ICD-10-CM | POA: Diagnosis not present

## 2023-11-10 DIAGNOSIS — K922 Gastrointestinal hemorrhage, unspecified: Secondary | ICD-10-CM | POA: Diagnosis not present

## 2023-11-12 ENCOUNTER — Ambulatory Visit: Payer: Self-pay | Admitting: Gastroenterology

## 2023-11-12 LAB — IGA: Immunoglobulin A: 438 mg/dL — ABNORMAL HIGH (ref 70–320)

## 2023-11-12 LAB — TISSUE TRANSGLUTAMINASE, IGA: (tTG) Ab, IgA: <1 U/mL

## 2023-11-12 LAB — IGM: IgM, Serum: 107 mg/dL (ref 50–300)

## 2023-11-12 LAB — IGG: IgG (Immunoglobin G), Serum: 1399 mg/dL (ref 600–1540)

## 2023-12-04 DIAGNOSIS — E039 Hypothyroidism, unspecified: Secondary | ICD-10-CM | POA: Diagnosis not present

## 2023-12-04 DIAGNOSIS — I129 Hypertensive chronic kidney disease with stage 1 through stage 4 chronic kidney disease, or unspecified chronic kidney disease: Secondary | ICD-10-CM | POA: Diagnosis not present

## 2023-12-04 DIAGNOSIS — Z7901 Long term (current) use of anticoagulants: Secondary | ICD-10-CM | POA: Diagnosis not present

## 2023-12-04 DIAGNOSIS — Z13220 Encounter for screening for lipoid disorders: Secondary | ICD-10-CM | POA: Diagnosis not present

## 2023-12-04 DIAGNOSIS — N1831 Chronic kidney disease, stage 3a: Secondary | ICD-10-CM | POA: Diagnosis not present

## 2023-12-07 DIAGNOSIS — Z7901 Long term (current) use of anticoagulants: Secondary | ICD-10-CM | POA: Diagnosis not present

## 2023-12-07 DIAGNOSIS — I129 Hypertensive chronic kidney disease with stage 1 through stage 4 chronic kidney disease, or unspecified chronic kidney disease: Secondary | ICD-10-CM | POA: Diagnosis not present

## 2023-12-07 DIAGNOSIS — Z13220 Encounter for screening for lipoid disorders: Secondary | ICD-10-CM | POA: Diagnosis not present

## 2023-12-07 DIAGNOSIS — E039 Hypothyroidism, unspecified: Secondary | ICD-10-CM | POA: Diagnosis not present

## 2023-12-07 DIAGNOSIS — N1831 Chronic kidney disease, stage 3a: Secondary | ICD-10-CM | POA: Diagnosis not present

## 2023-12-11 ENCOUNTER — Other Ambulatory Visit (HOSPITAL_COMMUNITY): Payer: Self-pay | Admitting: Family Medicine

## 2023-12-11 ENCOUNTER — Ambulatory Visit (HOSPITAL_COMMUNITY)
Admission: RE | Admit: 2023-12-11 | Discharge: 2023-12-11 | Disposition: A | Discharge status: Home / Self Care | Source: Ambulatory Visit | Attending: Family Medicine | Admitting: Family Medicine

## 2023-12-11 DIAGNOSIS — N1831 Chronic kidney disease, stage 3a: Secondary | ICD-10-CM | POA: Diagnosis not present

## 2023-12-11 DIAGNOSIS — R932 Abnormal findings on diagnostic imaging of liver and biliary tract: Secondary | ICD-10-CM | POA: Diagnosis not present

## 2023-12-11 DIAGNOSIS — K7689 Other specified diseases of liver: Secondary | ICD-10-CM | POA: Diagnosis not present

## 2023-12-11 DIAGNOSIS — R7309 Other abnormal glucose: Secondary | ICD-10-CM | POA: Diagnosis not present

## 2023-12-11 DIAGNOSIS — R7989 Other specified abnormal findings of blood chemistry: Secondary | ICD-10-CM | POA: Diagnosis not present

## 2023-12-11 DIAGNOSIS — R17 Unspecified jaundice: Secondary | ICD-10-CM | POA: Insufficient documentation

## 2023-12-11 DIAGNOSIS — Z0000 Encounter for general adult medical examination without abnormal findings: Secondary | ICD-10-CM | POA: Diagnosis not present

## 2023-12-11 DIAGNOSIS — Z7901 Long term (current) use of anticoagulants: Secondary | ICD-10-CM | POA: Diagnosis not present

## 2023-12-11 DIAGNOSIS — E559 Vitamin D deficiency, unspecified: Secondary | ICD-10-CM | POA: Diagnosis not present

## 2023-12-11 DIAGNOSIS — Z86711 Personal history of pulmonary embolism: Secondary | ICD-10-CM | POA: Diagnosis not present

## 2023-12-11 DIAGNOSIS — E039 Hypothyroidism, unspecified: Secondary | ICD-10-CM | POA: Diagnosis not present

## 2023-12-11 DIAGNOSIS — I129 Hypertensive chronic kidney disease with stage 1 through stage 4 chronic kidney disease, or unspecified chronic kidney disease: Secondary | ICD-10-CM | POA: Diagnosis not present

## 2023-12-11 MED ORDER — GADOBUTROL 1 MMOL/ML IV SOLN
9.0000 mL | Freq: Once | INTRAVENOUS | Status: AC | PRN
  Administered 2023-12-11: 9 mL via INTRAVENOUS

## 2023-12-24 ENCOUNTER — Other Ambulatory Visit (INDEPENDENT_AMBULATORY_CARE_PROVIDER_SITE_OTHER)

## 2023-12-24 ENCOUNTER — Encounter: Payer: Self-pay | Admitting: Gastroenterology

## 2023-12-24 ENCOUNTER — Ambulatory Visit: Admitting: Gastroenterology

## 2023-12-24 VITALS — BP 110/70 | HR 72 | Ht 70.0 in | Wt 198.0 lb

## 2023-12-24 DIAGNOSIS — R5383 Other fatigue: Secondary | ICD-10-CM

## 2023-12-24 DIAGNOSIS — R17 Unspecified jaundice: Secondary | ICD-10-CM

## 2023-12-24 DIAGNOSIS — R7401 Elevation of levels of liver transaminase levels: Secondary | ICD-10-CM

## 2023-12-24 DIAGNOSIS — L299 Pruritus, unspecified: Secondary | ICD-10-CM

## 2023-12-24 DIAGNOSIS — K2100 Gastro-esophageal reflux disease with esophagitis, without bleeding: Secondary | ICD-10-CM

## 2023-12-24 LAB — BASIC METABOLIC PANEL WITH GFR
BUN: 23 mg/dL (ref 6–23)
CO2: 25 meq/L (ref 19–32)
Calcium: 8.8 mg/dL (ref 8.4–10.5)
Chloride: 102 meq/L (ref 96–112)
Creatinine, Ser: 1.66 mg/dL — ABNORMAL HIGH (ref 0.40–1.50)
GFR: 41.29 mL/min — ABNORMAL LOW (ref >60.00)
Glucose, Bld: 105 mg/dL — ABNORMAL HIGH (ref 70–99)
Potassium: 3.9 meq/L (ref 3.5–5.1)
Sodium: 135 meq/L (ref 135–145)

## 2023-12-24 LAB — HEPATIC FUNCTION PANEL
ALT: 357 U/L — ABNORMAL HIGH (ref 0–53)
AST: 627 U/L — ABNORMAL HIGH (ref 0–37)
Albumin: 3.4 g/dL — ABNORMAL LOW (ref 3.5–5.2)
Alkaline Phosphatase: 138 U/L — ABNORMAL HIGH (ref 39–117)
Bilirubin, Direct: 12.4 mg/dL — ABNORMAL HIGH (ref 0.0–0.3)
Total Bilirubin: 22.4 mg/dL — ABNORMAL HIGH (ref 0.2–1.2)
Total Protein: 6.7 g/dL (ref 6.0–8.3)

## 2023-12-24 LAB — PROTIME-INR
INR: 1.9 ratio — ABNORMAL HIGH (ref 0.8–1.0)
Prothrombin Time: 19.7 s — ABNORMAL HIGH (ref 9.6–13.1)

## 2023-12-24 MED ORDER — HYDROXYZINE HCL 25 MG PO TABS: 25.0000 mg | ORAL_TABLET | Freq: Every day | ORAL | 1 refills | Status: DC

## 2023-12-24 NOTE — Progress Notes (Unsigned)
 Chief Complaint:    Elevated liver enzymes  GI History: 73 year old male with a history of unprovoked DVT/PE, CKD 3, diverticulosis, BPH, colon polyps, admitted to the hospital in 08/2023 with upper GI bleed with symptomatic acute blood loss anemia.  Was diagnosed with erosive esophagitis, gastritis, gastric fundus ulcer treated with endoscopic therapy as below.  Coumadin was stopped and was transitioned to Eliquis .  Stopped all NSAIDs.  - 09/02/2023: EGD during inpatient admission for melena, symptomatic anemia, supratherapeutic INR: LA Grade B erosive esophagitis, gastritis, ulcer in the gastric fundus treated with hemostatic clips x 3 and epinephrine .  Treated with 2 units RBCs.  Coumadin stopped and transitioned to Eliquis  - 09/29/2023: Follow-up in the GI clinic.  No recurrence of bleeding. - 11/02/2023: EGD: Benign gastric inlet patch otherwise normal esophagus.  Mild gastritis, much improved from prior.  Path negative for H. pylori.  No residual fundic ulcer.  Benign duodenal lymphangiectasia.  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 - 10/2023: Negative celiac panel.  IgA slightly elevated 4-38 with otherwise normal IgG and IgM.  H/H 11.6/34.6, ferritin 10, iron 42, TIBC 393, sat 10.7%.  Started on ferrous sulfate with repeat labs 3 months.  Separately, history of colon polyps.  Colonoscopy in 08/2019 with 9 mm adenoma, left-sided diverticulosis, internal hemorrhoids.  HPI:     Patient is a 73 y.o. male presenting to the Gastroenterology Clinic for follow-up. Was last seen in the GI clinic on 09/29/2023 by Alan, for hospital follow-up after admission for upper GI bleed as above.  Completed 8 weeks of high-dose PPI, then reduced Protonix  to 40 mg daily.  Repeat EGD on 11/02/2023 as above.  Continues to take Eliquis  without any recent bleeding.  Main issue today is evaluation of elevated liver enzymes.  He went to [employer] in mid July and noticed dark urine started then.  Since then developed fatigue, sluggishness, then eventually jaundice, icteric sclera, and pruritus towards the end of July.  Went to his PCP on 8/8 for evaluation with labs as below.  No prior similar symptoms.  No known sick contacts or anybody with similar symptoms.  Aside from ferrous sulfate, denies any OTCs, supplements.  Currently in a research study and receiving Leqvio every 6 months.  Recent labs by Dr. Gerome reviewed in epic and on patient's phone today and notable for the following: - 12/04/2023: AST/ALT 1078/705, Tbili 6.9, ALP 132, alb 3.9. BUN/creat 17/1.5, Na 138/ H/H 13.9/44.3, WBC 6.8, PLT 253 - 12/11/2023: AST/ALT 1017/619. Acute hep panel negative for HAV IgM, HBsAg, HBcAb IgM, HCV Ab.  Had follow-up with Dr. Gerome and stopped all medications aside from amlodipine  and Eliquis  (stopped pantoprazole , vitamin B, D, C, magnesium) - 12/11/2023: MRI abdomen: Diffusely reticular appearance of the liver parenchyma with periportal edema and trace perihepatic ascites with findings suggestive of nonspecific infectious or inflammatory hepatitis.  Some degree of underlying hepatic parenchymal fibrosis but no overt cirrhotic morphology.  Unusual portal varices suggestive of portal hypertension.  PV patent.  GB wall thickening without duct dilation.  Normal-appearing GI tract.  Comparison labs during hospital admission in 08/2023: - AST/ALT 32/41, T. bili 0.5, ALP 43  I reviewed outside notes from Dr. Gerome along with outside labs and imaging as outlined above.  Review of systems:     No chest pain, no SOB, no fevers, no urinary sx   Past Medical History:  Diagnosis Date   Adenomatous polyp of colon 10/2004  BPH (benign prostatic hyperplasia)    Chronic kidney disease, stage 3 (HCC)    Deep vein thrombosis (DVT) (HCC)    Diverticulosis    Hypertension    Hypothyroidism    Pulmonary embolism (HCC)    Pure hypercholesterolemia     Vitamin D deficiency     Patient's surgical history, family medical history, social history, medications and allergies were all reviewed in Epic    Current Outpatient Medications  Medication Sig Dispense Refill   amLODipine  (NORVASC ) 2.5 MG tablet Take 1 tablet (2.5 mg total) by mouth at bedtime.     apixaban  (ELIQUIS ) 5 MG TABS tablet Take 1 tablet (5 mg total) by mouth 2 (two) times daily. 60 tablet 0   Ascorbic Acid (VITAMIN C) 1000 MG tablet Take 1,000 mg by mouth daily. (Patient not taking: Reported on 12/24/2023)     B Complex Vitamins (B COMPLEX PO) Take 1 tablet by mouth daily. (Patient not taking: Reported on 12/24/2023)     Cholecalciferol (VITAMIN D3) 50 MCG (2000 UT) capsule Take 2,000 Units by mouth daily. (Patient not taking: Reported on 12/24/2023)     MAGNESIUM PO Take 1 tablet by mouth daily. (Patient not taking: Reported on 12/24/2023)     pantoprazole  (PROTONIX ) 40 MG tablet Take 1 tablet (40 mg total) by mouth 2 (two) times daily. (Patient not taking: Reported on 12/24/2023) 60 tablet 2   No current facility-administered medications for this visit.    Physical Exam:     BP 110/70   Pulse 72   Ht 5' 10 (1.778 m)   Wt 198 lb (89.8 kg)   BMI 28.41 kg/m   GENERAL:  Pleasant male in NAD PSYCH: : Cooperative, normal affect EENT: Icteric sclera.  Mucous membranes moist CARDIAC:  RRR, no murmur heard, no peripheral edema PULM: Normal respiratory effort, lungs CTA bilaterally, no wheezing ABDOMEN:  Nondistended, soft, nontender SKIN: Jaundiced complexion Musculoskeletal:  Normal muscle tone, normal strength NEURO: Alert and oriented x 3, no focal neurologic deficits   IMPRESSION and PLAN:    1) Elevated liver enzymes 2) Elevated bilirubin 3) Jaundice/Icteric sclera 4) Pruritus 5) Fatigue Acute onset significantly elevated liver enzymes.  No prior history of liver disease, and in fact had normal liver enzymes in 08/2023.  Symptoms started while vacationing in [place] with progressive fatigue and jaundice.  AST/ALT profoundly elevated earlier this month with bilirubin 6.9.  MRI with diffusely reticular appearance of the liver parenchyma and trace perihepatic ascites, but no duct dilation or suggestion of choledocholithiasis.  ALP only mildly elevated at 132.  Negative viral hepatitis panel.  - Repeat liver enzymes today to trend - Repeat BMP check - Check PT/INR to evaluate synthetic function (is on Eliquis ) - He is on a trial medication, Leqvio, but no elevated liver enzymes or hepatic impairment noted in ADR profile - Pantoprazole  stopped previously.  Holding all vitamins and supplements - Will do extended serologic evaluation, to include evaluation for additional viral hepatitides.  Of note, immunoglobulin panel recently checked with normal IgM, IgG, and mildly elevated IgA (438).  No e/o hemochromatosis or Celiac on recent labs - Depending on lab results, may plan for repeat imaging with ultrasound with Doppler and possibly liver biopsy - Atarax  25 mg at bedtime and can increase to BID if needed  6) GERD with erosive esophagitis - Esophagitis resolved on recent repeat upper endoscopy.  Reflux symptoms currently quiescent.  Protonix  stopped as above and will treat conservatively for the time being  I spent 45 minutes of time, including in depth chart review, independent review of results as outlined above, communicating results with the patient directly, face-to-face time with the patient, coordinating care, ordering studies and medications as appropriate, and documentation.      Sandor GAILS Juliani Laduke ,DO, FACG 12/24/2023, 1:35 PM

## 2023-12-24 NOTE — Patient Instructions (Signed)
 _______________________________________________________  If your blood pressure at your visit was 140/90 or greater, please contact your primary care physician to follow up on this.  _______________________________________________________  If you are age 71 or older, your body mass index should be between 23-30. Your Body mass index is 28.41 kg/m. If this is out of the aforementioned range listed, please consider follow up with your Primary Care Provider.  If you are age 78 or younger, your body mass index should be between 19-25. Your Body mass index is 28.41 kg/m. If this is out of the aformentioned range listed, please consider follow up with your Primary Care Provider.   ________________________________________________________  The [facility] providers would like to encourage you to use MYCHART to communicate with providers for non-urgent requests or questions.  Due to long hold times on the telephone, sending your provider a message by [facility] may be a faster and more efficient way to get a response.  Please allow 48 business hours for a response.  Please remember that this is for non-urgent requests.  _______________________________________________________  [facility] is using a team-based approach to care.  Your team is made up of your doctor and two to three APPS. Our APPS (Nurse Practitioners and Physician Assistants) work with your physician to ensure care continuity for you. They are fully qualified to address your health concerns and develop a treatment plan. They communicate directly with your gastroenterologist to care for you. Seeing the Advanced Practice Practitioners on your physician's team can help you by facilitating care more promptly, often allowing for earlier appointments, access to diagnostic testing, procedures, and other specialty referrals.   Your provider has requested that you go to the basement level for lab work before leaving today. Press B on the  elevator. The lab is located at the first door on the left as you exit the elevator.  We have sent the following medications to your pharmacy for you to pick up at your convenience:  START: Atarax  25mg  one tablet at bedtime  Due to recent changes in healthcare laws, you may see the results of your imaging and laboratory studies on MyChart before your provider has had a chance to review them.  We understand that in some cases there may be results that are confusing or concerning to you. Not all laboratory results come back in the same time frame and the provider may be waiting for multiple results in order to interpret others.  Please give us  48 hours in order for your provider to thoroughly review all the results before contacting the office for clarification of your results.   It was a pleasure to see you today!  Vito Cirigliano, D.O.

## 2023-12-25 ENCOUNTER — Ambulatory Visit: Payer: Self-pay | Admitting: Gastroenterology

## 2023-12-25 ENCOUNTER — Other Ambulatory Visit: Payer: Self-pay | Admitting: Physician Assistant

## 2023-12-25 ENCOUNTER — Other Ambulatory Visit: Payer: Self-pay

## 2023-12-25 DIAGNOSIS — R7401 Elevation of levels of liver transaminase levels: Secondary | ICD-10-CM

## 2023-12-25 DIAGNOSIS — R17 Unspecified jaundice: Secondary | ICD-10-CM

## 2023-12-29 ENCOUNTER — Other Ambulatory Visit (INDEPENDENT_AMBULATORY_CARE_PROVIDER_SITE_OTHER)

## 2023-12-29 ENCOUNTER — Ambulatory Visit: Payer: Self-pay | Admitting: Gastroenterology

## 2023-12-29 ENCOUNTER — Other Ambulatory Visit: Payer: Self-pay

## 2023-12-29 DIAGNOSIS — R7401 Elevation of levels of liver transaminase levels: Secondary | ICD-10-CM

## 2023-12-29 DIAGNOSIS — R17 Unspecified jaundice: Secondary | ICD-10-CM

## 2023-12-29 DIAGNOSIS — R768 Other specified abnormal immunological findings in serum: Secondary | ICD-10-CM

## 2023-12-29 LAB — ALT: ALT: 327 U/L — ABNORMAL HIGH (ref 0–53)

## 2023-12-29 LAB — AST: AST: 535 U/L — ABNORMAL HIGH (ref 0–37)

## 2023-12-30 ENCOUNTER — Ambulatory Visit (INDEPENDENT_AMBULATORY_CARE_PROVIDER_SITE_OTHER)

## 2023-12-30 DIAGNOSIS — R17 Unspecified jaundice: Secondary | ICD-10-CM | POA: Diagnosis not present

## 2023-12-30 DIAGNOSIS — R7401 Elevation of levels of liver transaminase levels: Secondary | ICD-10-CM

## 2023-12-30 LAB — BILIRUBIN, DIRECT: Bilirubin, Direct: 13.4 mg/dL — ABNORMAL HIGH (ref 0.0–0.3)

## 2023-12-30 LAB — IGA: Immunoglobulin A: 507 mg/dL — ABNORMAL HIGH (ref 70–320)

## 2023-12-30 LAB — BILIRUBIN, TOTAL: Total Bilirubin: 21 mg/dL — ABNORMAL HIGH (ref 0.2–1.2)

## 2023-12-31 ENCOUNTER — Ambulatory Visit: Payer: Self-pay | Admitting: Gastroenterology

## 2023-12-31 ENCOUNTER — Other Ambulatory Visit: Payer: Self-pay

## 2023-12-31 DIAGNOSIS — R17 Unspecified jaundice: Secondary | ICD-10-CM

## 2023-12-31 DIAGNOSIS — R7401 Elevation of levels of liver transaminase levels: Secondary | ICD-10-CM

## 2023-12-31 LAB — CERULOPLASMIN: Ceruloplasmin: 33 mg/dL — ABNORMAL HIGH (ref 14–30)

## 2023-12-31 LAB — ANTI-SMOOTH MUSCLE ANTIBODY, IGG: Actin (Smooth Muscle) Antibody (IGG): 26 U — ABNORMAL HIGH (ref <20)

## 2023-12-31 LAB — HEPATITIS E VIRUS (HEV) ANTIBODIES (IGG, IGM)
Hepatitis E Antibody Igm: NOT DETECTED
Hepatitis E IgG Abs: NOT DETECTED

## 2023-12-31 LAB — ALPHA-1-ANTITRYPSIN: A-1 Antitrypsin, Ser: 205 mg/dL — ABNORMAL HIGH (ref 83–199)

## 2023-12-31 LAB — EPSTEIN-BARR VIRUS VCA, IGM: EBV VCA IgM: <36 U/mL

## 2023-12-31 LAB — MITOCHONDRIAL ANTIBODIES: Mitochondrial M2 Ab, IgG: <=20 U (ref <=20.0)

## 2023-12-31 LAB — ANA: Anti Nuclear Antibody (ANA): NEGATIVE

## 2024-01-07 ENCOUNTER — Other Ambulatory Visit (INDEPENDENT_AMBULATORY_CARE_PROVIDER_SITE_OTHER)

## 2024-01-07 ENCOUNTER — Ambulatory Visit: Payer: Self-pay | Admitting: Gastroenterology

## 2024-01-07 DIAGNOSIS — R17 Unspecified jaundice: Secondary | ICD-10-CM | POA: Diagnosis not present

## 2024-01-07 DIAGNOSIS — R7401 Elevation of levels of liver transaminase levels: Secondary | ICD-10-CM

## 2024-01-07 LAB — HEPATIC FUNCTION PANEL
ALT: 227 U/L — ABNORMAL HIGH (ref 0–53)
AST: 367 U/L — ABNORMAL HIGH (ref 0–37)
Albumin: 3.4 g/dL — ABNORMAL LOW (ref 3.5–5.2)
Alkaline Phosphatase: 122 U/L — ABNORMAL HIGH (ref 39–117)
Bilirubin, Direct: 8.4 mg/dL — ABNORMAL HIGH (ref 0.0–0.3)
Total Bilirubin: 15.6 mg/dL — ABNORMAL HIGH (ref 0.2–1.2)
Total Protein: 6.7 g/dL (ref 6.0–8.3)

## 2024-01-08 ENCOUNTER — Other Ambulatory Visit: Payer: Self-pay

## 2024-01-08 DIAGNOSIS — R17 Unspecified jaundice: Secondary | ICD-10-CM

## 2024-01-08 DIAGNOSIS — R7401 Elevation of levels of liver transaminase levels: Secondary | ICD-10-CM

## 2024-01-14 ENCOUNTER — Ambulatory Visit (INDEPENDENT_AMBULATORY_CARE_PROVIDER_SITE_OTHER): Admitting: Podiatry

## 2024-01-14 DIAGNOSIS — B351 Tinea unguium: Secondary | ICD-10-CM | POA: Diagnosis not present

## 2024-01-14 DIAGNOSIS — M79675 Pain in left toe(s): Secondary | ICD-10-CM

## 2024-01-14 DIAGNOSIS — M79674 Pain in right toe(s): Secondary | ICD-10-CM

## 2024-01-14 NOTE — Progress Notes (Signed)
  Subjective:  Patient ID: Hunter Sandoval, male    DOB: 1951-02-15,  MRN: [id number]  Chief Complaint  Patient presents with   Nail Problem    RM 23 RFC/Nail Trim    73 y.o. male returns for follow-up with the above complaint. History confirmed with patient. Previous debridements have been helpful in reducing pain and improving his ambulation and function.    Objective:  Physical Exam: warm, good capillary refill, bunions, no trophic changes or ulcerative lesions, normal DP and PT pulses, normal sensory exam. Onychomycosis x10 with yellow discoloration and thickening of the nail plates.  Assessment:   1. Pain due to onychomycosis of toenails of both feet        Plan:  Patient was evaluated and treated and all questions answered.  Discussed the etiology and treatment options for the condition in detail with the patient.  Previous debridements helpful so far. Recommended debridement of the nails today. Sharp and mechanical debridement performed of all painful and mycotic nails today. Nails debrided in length and thickness using a nail nipper and a mechanical burr to level of comfort. Discussed treatment options including appropriate shoe gear. Follow up in 3 months for painful thickened mycotic nails     Return in about 3 months (around 04/14/2024) for painful thick fungal nails.

## 2024-01-21 ENCOUNTER — Other Ambulatory Visit (INDEPENDENT_AMBULATORY_CARE_PROVIDER_SITE_OTHER)

## 2024-01-21 DIAGNOSIS — R7401 Elevation of levels of liver transaminase levels: Secondary | ICD-10-CM | POA: Diagnosis not present

## 2024-01-21 DIAGNOSIS — R17 Unspecified jaundice: Secondary | ICD-10-CM

## 2024-01-21 LAB — PROTIME-INR
INR: 1.5 ratio — ABNORMAL HIGH (ref 0.8–1.0)
Prothrombin Time: 16 s — ABNORMAL HIGH (ref 9.6–13.1)

## 2024-01-21 LAB — HEPATIC FUNCTION PANEL
ALT: 147 U/L — ABNORMAL HIGH (ref 0–53)
AST: 201 U/L — ABNORMAL HIGH (ref 0–37)
Albumin: 3.6 g/dL (ref 3.5–5.2)
Alkaline Phosphatase: 102 U/L (ref 39–117)
Bilirubin, Direct: 3.6 mg/dL — ABNORMAL HIGH (ref 0.0–0.3)
Total Bilirubin: 7.1 mg/dL — ABNORMAL HIGH (ref 0.2–1.2)
Total Protein: 7.2 g/dL (ref 6.0–8.3)

## 2024-01-22 ENCOUNTER — Ambulatory Visit: Payer: Self-pay | Admitting: Gastroenterology

## 2024-02-14 ENCOUNTER — Other Ambulatory Visit: Payer: Self-pay | Admitting: Gastroenterology

## 2024-02-18 ENCOUNTER — Other Ambulatory Visit: Payer: Self-pay

## 2024-02-18 DIAGNOSIS — R7401 Elevation of levels of liver transaminase levels: Secondary | ICD-10-CM

## 2024-02-18 DIAGNOSIS — R17 Unspecified jaundice: Secondary | ICD-10-CM

## 2024-02-22 ENCOUNTER — Ambulatory Visit: Payer: Self-pay | Admitting: Gastroenterology

## 2024-02-22 ENCOUNTER — Other Ambulatory Visit (INDEPENDENT_AMBULATORY_CARE_PROVIDER_SITE_OTHER)

## 2024-02-22 DIAGNOSIS — R7401 Elevation of levels of liver transaminase levels: Secondary | ICD-10-CM

## 2024-02-22 DIAGNOSIS — R17 Unspecified jaundice: Secondary | ICD-10-CM | POA: Diagnosis not present

## 2024-02-22 LAB — HEPATIC FUNCTION PANEL
ALT: 63 U/L — ABNORMAL HIGH (ref 0–53)
AST: 71 U/L — ABNORMAL HIGH (ref 0–37)
Albumin: 3.8 g/dL (ref 3.5–5.2)
Alkaline Phosphatase: 70 U/L (ref 39–117)
Bilirubin, Direct: 0.9 mg/dL — ABNORMAL HIGH (ref 0.0–0.3)
Total Bilirubin: 1.9 mg/dL — ABNORMAL HIGH (ref 0.2–1.2)
Total Protein: 7.4 g/dL (ref 6.0–8.3)

## 2024-02-22 NOTE — Progress Notes (Signed)
 Patient informed of results of lab work per Dr San.  Patient advised to recheck labs in January 2026.  Patient requested follow up appointment with Dr San to further discuss plan going forward.  Patient scheduled for 04-12-24 with Dr San  Patient agreed to plan and verbalized understanding.  No further questions or concerns.

## 2024-04-12 ENCOUNTER — Ambulatory Visit: Admitting: Gastroenterology

## 2024-04-12 ENCOUNTER — Encounter: Payer: Self-pay | Admitting: Gastroenterology

## 2024-04-12 VITALS — BP 160/90 | HR 68 | Ht 70.0 in | Wt 202.0 lb

## 2024-04-12 DIAGNOSIS — R17 Unspecified jaundice: Secondary | ICD-10-CM

## 2024-04-12 DIAGNOSIS — R748 Abnormal levels of other serum enzymes: Secondary | ICD-10-CM

## 2024-04-12 DIAGNOSIS — K2100 Gastro-esophageal reflux disease with esophagitis, without bleeding: Secondary | ICD-10-CM

## 2024-04-12 NOTE — Progress Notes (Signed)
 Chief Complaint:    Elevated liver enzymes  GI History: 73 year old male with a history of unprovoked DVT/PE, CKD 3, diverticulosis, BPH, colon polyps, admitted to the hospital in 08/2023 with upper GI bleed with symptomatic acute blood loss anemia.  Was diagnosed with erosive esophagitis, gastritis, gastric fundus ulcer treated with endoscopic therapy as below.  Coumadin was stopped and was transitioned to Eliquis .  Stopped all NSAIDs.   - 09/02/2023: EGD during inpatient admission for melena, symptomatic anemia, supratherapeutic INR: LA Grade B erosive esophagitis, gastritis, ulcer in the gastric fundus treated with hemostatic clips x 3 and epinephrine .  Treated with 2 units RBCs.  Coumadin stopped and transitioned to Eliquis  - 09/29/2023: Follow-up in the GI clinic.  No recurrence of bleeding. - 11/02/2023: EGD: Benign gastric inlet patch otherwise normal esophagus.  Mild gastritis, much improved from prior.  Path negative for H. pylori.  No residual fundic ulcer.  Benign duodenal lymphangiectasia.  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 - 10/2023: Negative celiac panel.  IgA slightly elevated 4-38 with otherwise normal IgG and IgM.  H/H 11.6/34.6, ferritin 10, iron 42, TIBC 393, sat 10.7%.  Started on ferrous sulfate with repeat labs 3 months.   Separately, history of colon polyps.  Colonoscopy in 08/2019 with 9 mm adenoma, left-sided diverticulosis, internal hemorrhoids.  Separately, new onset elevated liver enzymes and jaundice in 10/2023.  Noticed dark urine, jaundice, icteric sclera, fatigue, sluggishness started during and progressed shortly after trip to [facility] in mid July and was evaluated by PCP on 12/04/2023.  No prior similar symptoms, sick contacts, etc. - 12/04/2023: AST/ALT 1078/705, Tbili 6.9, ALP 132, alb 3.9. BUN/creat 17/1.5, Na 138/ H/H 13.9/44.3, WBC 6.8, PLT 253 - 12/11/2023: AST/ALT 1017/619. Acute hep panel  negative for HAV IgM, HBsAg, HBcAb IgM, HCV Ab.  Had follow-up with Dr. Gerome and stopped all medications aside from amlodipine  and Eliquis  (stopped pantoprazole , vitamin B, D, C, magnesium) - 12/11/2023: MRI abdomen: Diffusely reticular appearance of the liver parenchyma with periportal edema and trace perihepatic ascites with findings suggestive of nonspecific infectious or inflammatory hepatitis.  Some degree of underlying hepatic parenchymal fibrosis but no overt cirrhotic morphology.  Unusual portal varices suggestive of portal hypertension.  PV patent.  GB wall thickening without duct dilation.  Normal-appearing GI tract. - 12/24/2023: Appointment in the GI clinic.  Mildly elevated anti-smooth muscle antibody at 26, otherwise normal extended serologic workup to include hepatitis C, EBV.  AST/ALT 627/357, T. bili 22.4, ALP 138 - 02/22/2024: AST/ALT 71/63, T. bili 1.9, ALP 70  HPI:     Patient is a 73 y.o. male presenting to the Gastroenterology Clinic for follow-up.  Was last seen by me on 12/24/2023 for follow-up after hospital admission as outlined above.  Main issue at that last appointment though was elevated liver enzymes as outlined above.  Extended serologic workup was otherwise negative/normal.  Liver enzymes has since been reliably downtrending with most recent panel on 02/22/2024 as above.    Today, he states that he feels well. No active issues or concerns today.  Jaundice and icteric sclera has resolved. Pruritus has resolved. Has been eating healthy and no wine/EtOH since August (baseline 4 drinks/week).    Review of systems:     No chest pain, no SOB, no fevers, no urinary sx   Past Medical History:  Diagnosis Date   Adenomatous polyp of colon 10/2004   BPH (benign prostatic hyperplasia)    Chronic kidney disease,  stage 3 (HCC)    Deep vein thrombosis (DVT) (HCC)    Diverticulosis    Hypertension    Hypothyroidism    Pulmonary embolism (HCC)    Pure hypercholesterolemia     Vitamin D deficiency     Patient's surgical history, family medical history, social history, medications and allergies were all reviewed in Epic    Current Outpatient Medications  Medication Sig Dispense Refill   amLODipine  (NORVASC ) 2.5 MG tablet Take 1 tablet (2.5 mg total) by mouth at bedtime.     apixaban  (ELIQUIS ) 5 MG TABS tablet Take 1 tablet (5 mg total) by mouth 2 (two) times daily. 60 tablet 0   Ascorbic Acid (VITAMIN C) 1000 MG tablet Take 1,000 mg by mouth daily. (Patient not taking: Reported on 12/24/2023)     B Complex Vitamins (B COMPLEX PO) Take 1 tablet by mouth daily. (Patient not taking: Reported on 12/24/2023)     Cholecalciferol (VITAMIN D3) 50 MCG (2000 UT) capsule Take 2,000 Units by mouth daily. (Patient not taking: Reported on 12/24/2023)     hydrOXYzine  (ATARAX ) 25 MG tablet TAKE 1 TABLET BY MOUTH EVERYDAY AT BEDTIME 90 tablet 3   MAGNESIUM PO Take 1 tablet by mouth daily. (Patient not taking: Reported on 12/24/2023)     pantoprazole  (PROTONIX ) 40 MG tablet TAKE 1 TABLET BY MOUTH TWICE A DAY 180 tablet 3   No current facility-administered medications for this visit.    Physical Exam:     There were no vitals taken for this visit.  GENERAL:  Pleasant male in NAD PSYCH: : Cooperative, normal affect CARDIAC:  RRR, no murmur heard, no peripheral edema PULM: Normal respiratory effort, lungs CTA bilaterally, no wheezing ABDOMEN:  Nondistended, soft, nontender. No obvious masses, no hepatomegaly,  normal bowel sounds SKIN:  turgor, no lesions seen Musculoskeletal:  Normal muscle tone, normal strength NEURO: Alert and oriented x 3, no focal neurologic deficits   IMPRESSION and PLAN:    1) Elevated liver enzymes-resolving 2) Jaundice-resolved Unclear etiology for significantly elevated liver enzymes, but thankfully all downtrending.  Has had clinical resolution of jaundice, icteric sclera, pruritus, and feeling back to his baseline state of health.  Discussed  broad DDx.  Extent serologic workup was otherwise negative/normal. - Repeat liver enzymes next month.  If normalization, can repeat periodically - Can restart OTC vitamin supplements  3) GERD with erosive esophagitis 4) History of gastric ulcer Resolution of esophagitis and ulcer on repeat endoscopy in 10/2023.  He would like to stay off Protonix  for the time being as his liver continues to heal.  Otherwise no breakthrough reflux symptoms.  Will follow clinically for the time being.    RTC in 6 months or sooner as needed  I spent 32 minutes of time, including in depth chart review, independent review of results as outlined above, communicating results with the patient directly, face-to-face time with the patient, coordinating care, and ordering studies and medications as appropriate, and documentation.           Hunter Sandoval ,DO, FACG 04/12/2024, 8:37 AM

## 2024-04-12 NOTE — Patient Instructions (Addendum)
 _______________________________________________________  If your blood pressure at your visit was 140/90 or greater, please contact your primary care physician to follow up on this.  _______________________________________________________  If you are age 72 or older, your body mass index should be between 23-30. Your Body mass index is 28.98 kg/m. If this is out of the aforementioned range listed, please consider follow up with your Primary Care Provider.  If you are age 59 or younger, your body mass index should be between 19-25. Your Body mass index is 28.98 kg/m. If this is out of the aformentioned range listed, please consider follow up with your Primary Care Provider.   ________________________________________________________  The [facility] providers would like to encourage you to use MYCHART to communicate with providers for non-urgent requests or questions.  Due to long hold times on the telephone, sending your provider a message by [facility] may be a faster and more efficient way to get a response.  Please allow 48 business hours for a response.  Please remember that this is for non-urgent requests.  _______________________________________________________  [facility] is using a team-based approach to care.  Your team is made up of your doctor and two to three APPS. Our APPS (Nurse Practitioners and Physician Assistants) work with your physician to ensure care continuity for you. They are fully qualified to address your health concerns and develop a treatment plan. They communicate directly with your gastroenterologist to care for you. Seeing the Advanced Practice Practitioners on your physician's team can help you by facilitating care more promptly, often allowing for earlier appointments, access to diagnostic testing, procedures, and other specialty referrals.   Your provider has requested that you go to the basement level for lab work in January 2026.  We will contact you to  remind of you lab work. Press B on the elevator. The lab is located at the first door on the left as you exit the elevator.  Due to recent changes in healthcare laws, you may see the results of your imaging and laboratory studies on MyChart before your provider has had a chance to review them.  We understand that in some cases there may be results that are confusing or concerning to you. Not all laboratory results come back in the same time frame and the provider may be waiting for multiple results in order to interpret others.  Please give us  48 hours in order for your provider to thoroughly review all the results before contacting the office for clarification of your results.   It was a pleasure to see you today!  Vito Cirigliano, D.O.

## 2024-04-14 ENCOUNTER — Ambulatory Visit

## 2024-04-14 ENCOUNTER — Ambulatory Visit: Admitting: Podiatry

## 2024-04-14 DIAGNOSIS — M19071 Primary osteoarthritis, right ankle and foot: Secondary | ICD-10-CM

## 2024-04-14 DIAGNOSIS — M84374A Stress fracture, right foot, initial encounter for fracture: Secondary | ICD-10-CM

## 2024-04-14 DIAGNOSIS — B351 Tinea unguium: Secondary | ICD-10-CM

## 2024-04-17 NOTE — Progress Notes (Signed)
"  °  Subjective:  Patient ID: Hunter Sandoval, male    DOB: Jun 17, 1950,  MRN: [id number]  Chief Complaint  Patient presents with   Nail Problem    Rm 10 RFC    73 y.o. male returns for follow-up with the above complaint. History confirmed with patient. Previous debridements have been helpful in reducing pain and improving his ambulation and function.  Has a new issue of a pain in the right foot that is been ongoing for few months and worsening  Objective:  Physical Exam: warm, good capillary refill, bunions, no trophic changes or ulcerative lesions, normal DP and PT pulses, normal sensory exam. Onychomycosis x10 with yellow discoloration and thickening of the nail plates.  Tenderness to palpation around fourth MTP joint right foot   Radiographs taken today shows mild degenerative changes in the fourth MTP joint, no evidence of stress fracture  Assessment:   1. Arthritis of right foot   2. Pain due to onychomycosis of toenails of both feet        Plan:  Patient was evaluated and treated and all questions answered.  Discussed the etiology and treatment options for the condition in detail with the patient.  Previous debridements helpful so far. Recommended debridement of the nails today. Sharp and mechanical debridement performed of all painful and mycotic nails today. Nails debrided in length and thickness using a nail nipper and a mechanical burr to level of comfort. Discussed treatment options including appropriate shoe gear. 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  Also discussed corticosteroid injection if worsening.  Regular shoe gear and activity as tolerated is okay.  No follow-ups on file.  "

## 2024-05-02 DIAGNOSIS — R748 Abnormal levels of other serum enzymes: Secondary | ICD-10-CM

## 2024-05-03 ENCOUNTER — Other Ambulatory Visit (INDEPENDENT_AMBULATORY_CARE_PROVIDER_SITE_OTHER)

## 2024-05-03 ENCOUNTER — Ambulatory Visit: Payer: Self-pay | Admitting: Gastroenterology

## 2024-05-03 DIAGNOSIS — R748 Abnormal levels of other serum enzymes: Secondary | ICD-10-CM

## 2024-05-03 LAB — HEPATIC FUNCTION PANEL
ALT: 10 U/L (ref 3–53)
AST: 18 U/L (ref 5–37)
Albumin: 4.1 g/dL (ref 3.5–5.2)
Alkaline Phosphatase: 48 U/L (ref 39–117)
Bilirubin, Direct: 0.1 mg/dL (ref 0.1–0.3)
Total Bilirubin: 0.8 mg/dL (ref 0.2–1.2)
Total Protein: 7.5 g/dL (ref 6.0–8.3)

## 2024-07-14 ENCOUNTER — Ambulatory Visit: Admitting: Podiatry
